# Patient Record
Sex: Female | Born: 1960 | Race: White | Hispanic: No | Marital: Married | Smoking: Former smoker
Health system: Southern US, Community
[De-identification: ages and names within clinical notes are randomized; demographics above are authoritative.]

## PROBLEM LIST (undated history)

## (undated) DIAGNOSIS — K589 Irritable bowel syndrome without diarrhea: Secondary | ICD-10-CM

## (undated) DIAGNOSIS — N83209 Unspecified ovarian cyst, unspecified side: Secondary | ICD-10-CM

## (undated) DIAGNOSIS — F1020 Alcohol dependence, uncomplicated: Secondary | ICD-10-CM

## (undated) DIAGNOSIS — J45909 Unspecified asthma, uncomplicated: Secondary | ICD-10-CM

## (undated) DIAGNOSIS — F329 Major depressive disorder, single episode, unspecified: Secondary | ICD-10-CM

## (undated) DIAGNOSIS — G43909 Migraine, unspecified, not intractable, without status migrainosus: Secondary | ICD-10-CM

## (undated) DIAGNOSIS — N809 Endometriosis, unspecified: Secondary | ICD-10-CM

## (undated) DIAGNOSIS — J302 Other seasonal allergic rhinitis: Secondary | ICD-10-CM

## (undated) DIAGNOSIS — F99 Mental disorder, not otherwise specified: Secondary | ICD-10-CM

## (undated) DIAGNOSIS — J449 Chronic obstructive pulmonary disease, unspecified: Secondary | ICD-10-CM

## (undated) DIAGNOSIS — J3081 Allergic rhinitis due to animal (cat) (dog) hair and dander: Secondary | ICD-10-CM

## (undated) DIAGNOSIS — R921 Mammographic calcification found on diagnostic imaging of breast: Secondary | ICD-10-CM

## (undated) DIAGNOSIS — F419 Anxiety disorder, unspecified: Secondary | ICD-10-CM

## (undated) DIAGNOSIS — F319 Bipolar disorder, unspecified: Secondary | ICD-10-CM

## (undated) DIAGNOSIS — F32A Depression, unspecified: Secondary | ICD-10-CM

## (undated) DIAGNOSIS — I10 Essential (primary) hypertension: Secondary | ICD-10-CM

## (undated) DIAGNOSIS — J40 Bronchitis, not specified as acute or chronic: Secondary | ICD-10-CM

## (undated) DIAGNOSIS — G4700 Insomnia, unspecified: Secondary | ICD-10-CM

## (undated) DIAGNOSIS — D3610 Benign neoplasm of peripheral nerves and autonomic nervous system, unspecified: Secondary | ICD-10-CM

## (undated) HISTORY — DX: Unspecified ovarian cyst, unspecified side: N83.209

## (undated) HISTORY — DX: Other seasonal allergic rhinitis: J30.2

## (undated) HISTORY — DX: Endometriosis, unspecified: N80.9

## (undated) HISTORY — PX: BREAST BIOPSY: SHX20

## (undated) HISTORY — DX: Insomnia, unspecified: G47.00

## (undated) HISTORY — DX: Alcohol dependence, uncomplicated: F10.20

## (undated) HISTORY — DX: Major depressive disorder, single episode, unspecified: F32.9

## (undated) HISTORY — DX: Anxiety disorder, unspecified: F41.9

## (undated) HISTORY — DX: Depression, unspecified: F32.A

## (undated) HISTORY — DX: Mammographic calcification found on diagnostic imaging of breast: R92.1

## (undated) HISTORY — DX: Irritable bowel syndrome, unspecified: K58.9

## (undated) HISTORY — DX: Bronchitis, not specified as acute or chronic: J40

## (undated) HISTORY — DX: Essential (primary) hypertension: I10

## (undated) HISTORY — PX: ABDOMINAL HYSTERECTOMY: SHX81

## (undated) HISTORY — DX: Migraine, unspecified, not intractable, without status migrainosus: G43.909

## (undated) HISTORY — DX: Benign neoplasm of peripheral nerves and autonomic nervous system, unspecified: D36.10

## (undated) HISTORY — DX: Mental disorder, not otherwise specified: F99

## (undated) HISTORY — DX: Allergic rhinitis due to animal (cat) (dog) hair and dander: J30.81

## (undated) HISTORY — PX: WISDOM TOOTH EXTRACTION: SHX21

## (undated) HISTORY — DX: Bipolar disorder, unspecified: F31.9

---

## 1998-05-10 ENCOUNTER — Ambulatory Visit (HOSPITAL_COMMUNITY): Admission: RE | Admit: 1998-05-10 | Discharge: 1998-05-10 | Payer: Self-pay | Admitting: *Deleted

## 1998-05-10 ENCOUNTER — Other Ambulatory Visit: Admission: RE | Admit: 1998-05-10 | Discharge: 1998-05-10 | Payer: Self-pay | Admitting: *Deleted

## 1998-07-06 ENCOUNTER — Ambulatory Visit (HOSPITAL_COMMUNITY): Admission: RE | Admit: 1998-07-06 | Discharge: 1998-07-06 | Payer: Self-pay | Admitting: *Deleted

## 1998-07-06 ENCOUNTER — Encounter: Payer: Self-pay | Admitting: *Deleted

## 1998-10-20 ENCOUNTER — Inpatient Hospital Stay (HOSPITAL_COMMUNITY): Admission: AD | Admit: 1998-10-20 | Discharge: 1998-10-23 | Payer: Self-pay | Admitting: *Deleted

## 1999-06-18 ENCOUNTER — Other Ambulatory Visit: Admission: RE | Admit: 1999-06-18 | Discharge: 1999-06-18 | Payer: Self-pay | Admitting: *Deleted

## 2009-07-03 ENCOUNTER — Emergency Department (HOSPITAL_BASED_OUTPATIENT_CLINIC_OR_DEPARTMENT_OTHER): Admission: EM | Admit: 2009-07-03 | Discharge: 2009-07-03 | Payer: Self-pay | Admitting: Emergency Medicine

## 2009-07-03 ENCOUNTER — Ambulatory Visit: Payer: Self-pay | Admitting: Diagnostic Radiology

## 2010-07-14 ENCOUNTER — Ambulatory Visit: Payer: Self-pay | Admitting: Diagnostic Radiology

## 2010-07-14 ENCOUNTER — Ambulatory Visit (HOSPITAL_BASED_OUTPATIENT_CLINIC_OR_DEPARTMENT_OTHER): Admission: RE | Admit: 2010-07-14 | Discharge: 2010-07-14 | Payer: Self-pay | Admitting: Family Medicine

## 2011-01-24 LAB — GC/CHLAMYDIA PROBE AMP, GENITAL
Chlamydia, DNA Probe: NEGATIVE
GC Probe Amp, Genital: NEGATIVE

## 2011-01-24 LAB — WET PREP, GENITAL
Trich, Wet Prep: NONE SEEN
Yeast Wet Prep HPF POC: NONE SEEN

## 2011-01-24 LAB — CBC
HCT: 39 % (ref 36.0–46.0)
Hemoglobin: 13.5 g/dL (ref 12.0–15.0)
MCHC: 34.6 g/dL (ref 30.0–36.0)
MCV: 100.6 fL — ABNORMAL HIGH (ref 78.0–100.0)
Platelets: 307 10*3/uL (ref 150–400)
RBC: 3.88 MIL/uL (ref 3.87–5.11)
RDW: 12.9 % (ref 11.5–15.5)
WBC: 8.6 10*3/uL (ref 4.0–10.5)

## 2011-01-24 LAB — URINE MICROSCOPIC-ADD ON

## 2011-01-24 LAB — URINALYSIS, ROUTINE W REFLEX MICROSCOPIC
Bilirubin Urine: NEGATIVE
Glucose, UA: NEGATIVE mg/dL
Ketones, ur: NEGATIVE mg/dL
Nitrite: NEGATIVE
Protein, ur: NEGATIVE mg/dL
Specific Gravity, Urine: 1.007 (ref 1.005–1.030)
Urobilinogen, UA: 1 mg/dL (ref 0.0–1.0)
pH: 7 (ref 5.0–8.0)

## 2011-01-24 LAB — BASIC METABOLIC PANEL
BUN: 15 mg/dL (ref 6–23)
CO2: 27 mEq/L (ref 19–32)
Calcium: 9.3 mg/dL (ref 8.4–10.5)
Chloride: 108 mEq/L (ref 96–112)
Creatinine, Ser: 0.7 mg/dL (ref 0.4–1.2)
GFR calc Af Amer: >60 mL/min (ref >60)
GFR calc non Af Amer: >60 mL/min (ref >60)
Glucose, Bld: 86 mg/dL (ref 70–99)
Potassium: 4.6 mEq/L (ref 3.5–5.1)
Sodium: 141 mEq/L (ref 135–145)

## 2011-01-24 LAB — PREGNANCY, URINE: Preg Test, Ur: NEGATIVE

## 2011-01-24 LAB — DIFFERENTIAL
Basophils Absolute: 0.1 10*3/uL (ref 0.0–0.1)
Basophils Relative: 1 % (ref 0–1)
Eosinophils Absolute: 0.4 10*3/uL (ref 0.0–0.7)
Eosinophils Relative: 5 % (ref 0–5)
Lymphocytes Relative: 25 % (ref 12–46)
Lymphs Abs: 2.1 10*3/uL (ref 0.7–4.0)
Monocytes Absolute: 0.5 10*3/uL (ref 0.1–1.0)
Monocytes Relative: 6 % (ref 3–12)
Neutro Abs: 5.5 10*3/uL (ref 1.7–7.7)
Neutrophils Relative %: 63 % (ref 43–77)

## 2011-01-24 LAB — LITHIUM LEVEL: Lithium Lvl: 0.6 mEq/L — ABNORMAL LOW (ref 0.80–1.40)

## 2011-01-24 LAB — HEMOCCULT GUIAC POC 1CARD (OFFICE): Fecal Occult Bld: NEGATIVE

## 2013-03-26 DIAGNOSIS — I10 Essential (primary) hypertension: Secondary | ICD-10-CM | POA: Insufficient documentation

## 2013-03-26 DIAGNOSIS — G43909 Migraine, unspecified, not intractable, without status migrainosus: Secondary | ICD-10-CM | POA: Insufficient documentation

## 2013-03-26 DIAGNOSIS — J309 Allergic rhinitis, unspecified: Secondary | ICD-10-CM | POA: Insufficient documentation

## 2014-01-05 DIAGNOSIS — F411 Generalized anxiety disorder: Secondary | ICD-10-CM | POA: Insufficient documentation

## 2014-01-05 DIAGNOSIS — F3132 Bipolar disorder, current episode depressed, moderate: Secondary | ICD-10-CM | POA: Insufficient documentation

## 2014-06-28 ENCOUNTER — Ambulatory Visit: Payer: Self-pay | Admitting: Medical

## 2014-07-17 ENCOUNTER — Encounter: Payer: Self-pay | Admitting: Physician Assistant

## 2014-07-17 ENCOUNTER — Ambulatory Visit (INDEPENDENT_AMBULATORY_CARE_PROVIDER_SITE_OTHER): Payer: BC Managed Care – PPO | Admitting: Physician Assistant

## 2014-07-17 VITALS — BP 125/86 | HR 74 | Temp 98.4°F | Resp 16 | Ht 60.75 in | Wt 110.5 lb

## 2014-07-17 DIAGNOSIS — J42 Unspecified chronic bronchitis: Secondary | ICD-10-CM

## 2014-07-17 DIAGNOSIS — Z23 Encounter for immunization: Secondary | ICD-10-CM

## 2014-07-17 DIAGNOSIS — I10 Essential (primary) hypertension: Secondary | ICD-10-CM

## 2014-07-17 DIAGNOSIS — F3132 Bipolar disorder, current episode depressed, moderate: Secondary | ICD-10-CM

## 2014-07-17 DIAGNOSIS — R062 Wheezing: Secondary | ICD-10-CM

## 2014-07-17 MED ORDER — ATENOLOL-CHLORTHALIDONE 50-25 MG PO TABS: 0.5000 | ORAL_TABLET | Freq: Every day | ORAL | Status: DC

## 2014-07-17 MED ORDER — ALBUTEROL SULFATE HFA 108 (90 BASE) MCG/ACT IN AERS: 2.0000 | INHALATION_SPRAY | Freq: Four times a day (QID) | RESPIRATORY_TRACT | Status: DC | PRN

## 2014-07-17 MED ORDER — BUDESONIDE-FORMOTEROL FUMARATE 160-4.5 MCG/ACT IN AERO: 2.0000 | INHALATION_SPRAY | Freq: Two times a day (BID) | RESPIRATORY_TRACT | Status: DC

## 2014-07-17 NOTE — Progress Notes (Signed)
Pre visit review using our clinic review tool, if applicable. No additional management support is needed unless otherwise documented below in the visit note/SLS  

## 2014-07-17 NOTE — Patient Instructions (Addendum)
For you hip -- apply topical Aspercreme to the area.  Avoid prolonged sitting. Stretching exercises may be beneficial.  For lungs -- I am referring you to Pulmonology for formal testing of your lung capacity.  I feel that you are suffering from COPD. Please use the albuterol and symbicort inhalers as directed.  Please take a daily multivitamin and probiotic to help boost your immune system.  Follow-up with me within 1 month for a Complete Physical Examination with fasting labs.  Chronic Obstructive Pulmonary Disease Chronic obstructive pulmonary disease (COPD) is a common lung condition in which airflow from the lungs is limited. COPD is a general term that can be used to describe many different lung problems that limit airflow, including both chronic bronchitis and emphysema. If you have COPD, your lung function will probably never return to normal, but there are measures you can take to improve lung function and make yourself feel better.  CAUSES   Smoking (common).   Exposure to secondhand smoke.   Genetic problems.  Chronic inflammatory lung diseases or recurrent infections. SYMPTOMS   Shortness of breath, especially with physical activity.   Deep, persistent (chronic) cough with a large amount of thick mucus.   Wheezing.   Rapid breaths (tachypnea).   Gray or bluish discoloration (cyanosis) of the skin, especially in fingers, toes, or lips.   Fatigue.   Weight loss.   Frequent infections or episodes when breathing symptoms become much worse (exacerbations).   Chest tightness. DIAGNOSIS  Your health care provider will take a medical history and perform a physical examination to make the initial diagnosis. Additional tests for COPD may include:   Lung (pulmonary) function tests.  Chest X-ray.  CT scan.  Blood tests. TREATMENT  Treatment available to help you feel better when you have COPD includes:   Inhaler and nebulizer medicines. These help manage  the symptoms of COPD and make your breathing more comfortable.  Supplemental oxygen. Supplemental oxygen is only helpful if you have a low oxygen level in your blood.   Exercise and physical activity. These are beneficial for nearly all people with COPD. Some people may also benefit from a pulmonary rehabilitation program. HOME CARE INSTRUCTIONS   Take all medicines (inhaled or pills) as directed by your health care provider.  Avoid over-the-counter medicines or cough syrups that dry up your airway (such as antihistamines) and slow down the elimination of secretions unless instructed otherwise by your health care provider.   If you are a smoker, the most important thing that you can do is stop smoking. Continuing to smoke will cause further lung damage and breathing trouble. Ask your health care provider for help with quitting smoking. He or she can direct you to community resources or hospitals that provide support.  Avoid exposure to irritants such as smoke, chemicals, and fumes that aggravate your breathing.  Use oxygen therapy and pulmonary rehabilitation if directed by your health care provider. If you require home oxygen therapy, ask your health care provider whether you should purchase a pulse oximeter to measure your oxygen level at home.   Avoid contact with individuals who have a contagious illness.  Avoid extreme temperature and humidity changes.  Eat healthy foods. Eating smaller, more frequent meals and resting before meals may help you maintain your strength.  Stay active, but balance activity with periods of rest. Exercise and physical activity will help you maintain your ability to do things you want to do.  Preventing infection and hospitalization is very important  when you have COPD. Make sure to receive all the vaccines your health care provider recommends, especially the pneumococcal and influenza vaccines. Ask your health care provider whether you need a pneumonia  vaccine.  Learn and use relaxation techniques to manage stress.  Learn and use controlled breathing techniques as directed by your health care provider. Controlled breathing techniques include:   Pursed lip breathing. Start by breathing in (inhaling) through your nose for 1 second. Then, purse your lips as if you were going to whistle and breathe out (exhale) through the pursed lips for 2 seconds.   Diaphragmatic breathing. Start by putting one hand on your abdomen just above your waist. Inhale slowly through your nose. The hand on your abdomen should move out. Then purse your lips and exhale slowly. You should be able to feel the hand on your abdomen moving in as you exhale.   Learn and use controlled coughing to clear mucus from your lungs. Controlled coughing is a series of short, progressive coughs. The steps of controlled coughing are:  1. Lean your head slightly forward.  2. Breathe in deeply using diaphragmatic breathing.  3. Try to hold your breath for 3 seconds.  4. Keep your mouth slightly open while coughing twice.  5. Spit any mucus out into a tissue.  6. Rest and repeat the steps once or twice as needed. SEEK MEDICAL CARE IF:   You are coughing up more mucus than usual.   There is a change in the color or thickness of your mucus.   Your breathing is more labored than usual.   Your breathing is faster than usual.  SEEK IMMEDIATE MEDICAL CARE IF:   You have shortness of breath while you are resting.   You have shortness of breath that prevents you from:  Being able to talk.   Performing your usual physical activities.   You have chest pain lasting longer than 5 minutes.   Your skin color is more cyanotic than usual.  You measure low oxygen saturations for longer than 5 minutes with a pulse oximeter. MAKE SURE YOU:   Understand these instructions.  Will watch your condition.  Will get help right away if you are not doing well or get  worse. Document Released: 07/16/2005 Document Revised: 02/20/2014 Document Reviewed: 06/02/2013 [facility] Patient Information 2015 [place], [place]. This information is not intended to replace advice given to you by your health care provider. Make sure you discuss any questions you have with your health care provider.

## 2014-07-17 NOTE — Progress Notes (Signed)
Patient presents to clinic today to establish care.  Acute Concerns: Patient wishes to have her lungs checked before getting her flu shot.  Has history of acute bronchitis, most recent episode was 3-4 weeks ago and was treated with Azithromycin.  Patient is a smoker with a very significant pack-year history.  Chronic Issues: Bipolar Disorder -- Followed by [facility] for many years.  Currently on Lithium 300 mg TID and Xanax prn.  Doing well.  Has follow-up scheduled every 3 months.  Endorses recent lithium level within normal limits.  Allergic Rhinitis -- Well controlled with daily Claritin.  Hypertension -- Currently on Tenoretic with good control of BP.  HAs been out of medication for 1 week.  BP is within normal limits today in clinic.  Asymptomatic.  Atenolol in medication helps with migraines.  Past Medical History  Diagnosis Date  . Breast calcification, left   . Endometriosis   . Ovarian cyst   . Neuroma     Left Inguinal  . Bipolar 1 disorder   . Mental disorder   . Alcoholism     Resolved  . Hypertension   . Cat allergies   . Seasonal allergies   . Anxiety   . Insomnia   . Depression   . Migraine   . Bronchitis   . Irritable bowel syndrome (IBS)     Past Surgical History  Procedure Laterality Date  . Breast biopsy      Left  . Abdominal hysterectomy    . Wisdom tooth extraction      No current outpatient prescriptions on file prior to visit.   No current facility-administered medications on file prior to visit.    Allergies  Allergen Reactions  . Cat Hair Extract   . Morphine   . Erythromycin Rash  . Penicillins Rash  . Sulfa Antibiotics Rash    Family History  Problem Relation Age of Onset  . Hyperlipidemia Mother     Living  . Arthritis Mother   . Prostate cancer Father     Unsure if Cancer  . Allergies Father     Living  . Hearing loss Father   . Alcoholism Maternal Grandfather   . Diabetes Paternal  Grandmother   . Epilepsy Maternal Grandmother   . Alcoholism Paternal Grandfather   . Dementia Maternal Grandmother   . Stroke Paternal Grandmother   . Stroke Paternal Grandfather   . Heart disease Maternal Aunt   . Transient ischemic attack Maternal Aunt   . Hyperlipidemia Sister     #1  . Healthy Brother   . Neuropathy Sister     #2  . Irritable bowel syndrome Sister   . Irritable bowel syndrome Father   . Irritable bowel syndrome Son     #1  . Asthma Son   . Allergies Son   . Mental illness Son     Social D/O  . Allergies Son     #2, #3  . Hearing loss Son     #2  . Other Paternal Grandfather     Colon Polyps    History   Social History  . Marital Status: Married    Spouse Name: N/A    Number of Children: N/A  . Years of Education: N/A   Occupational History  . Not on file.   Social History Main Topics  . Smoking status: Current Every Day Smoker -- 0.10 packs/day for 20 years    Types: Cigarettes  . Smokeless tobacco:  Never Used  . Alcohol Use: Not on file  . Drug Use: No  . Sexual Activity: Not Currently   Other Topics Concern  . Not on file   Social History Narrative  . No narrative on file   ROS See HPI.  All other ROS are negative.  BP 125/86  Pulse 74  Temp(Src) 98.4 F (36.9 C) (Oral)  Resp 16  Ht 5' 0.75" (1.543 m)  Wt 110 lb 8 oz (50.122 kg)  BMI 21.05 kg/m2  SpO2 97%  Physical Exam  Vitals reviewed. Constitutional: She is oriented to person, place, and time and well-developed, well-nourished, and in no distress.  HENT:  Head: Normocephalic and atraumatic.  Right Ear: External ear normal.  Left Ear: External ear normal.  Nose: Nose normal.  Mouth/Throat: Oropharynx is clear and moist. No oropharyngeal exudate.  Eyes: Conjunctivae are normal.  Neck: Neck supple.  Cardiovascular: Normal rate, regular rhythm, normal heart sounds and intact distal pulses.   Pulmonary/Chest: Effort normal. No respiratory distress. She has wheezes.  She has no rales. She exhibits no tenderness.  Lymphadenopathy:    She has no cervical adenopathy.  Neurological: She is alert and oriented to person, place, and time.  Skin: Skin is warm and dry. No rash noted.  Psychiatric: Affect normal.   Assessment/Plan: Bipolar affective disorder, currently depressed, moderate Followed by Psychiatry. Doing well.  Continue current regimen.  Need for prophylactic vaccination and inoculation against influenza Immunization given by nursing staff.  Unspecified chronic bronchitis Concern for COPD giving recurrent bronchitis and significant smoking history.  Rx Xopenex.  Referral placed to Pulmonology for PFTs and evaluation.  Essential hypertension, benign Will decrease Tenoretic to 1/2 tablet once daily.  Follow-up in 3-4 weeks for a BP recheck.

## 2014-07-18 DIAGNOSIS — Z23 Encounter for immunization: Secondary | ICD-10-CM | POA: Insufficient documentation

## 2014-07-18 DIAGNOSIS — J42 Unspecified chronic bronchitis: Secondary | ICD-10-CM | POA: Insufficient documentation

## 2014-07-18 DIAGNOSIS — I10 Essential (primary) hypertension: Secondary | ICD-10-CM | POA: Insufficient documentation

## 2014-07-18 NOTE — Assessment & Plan Note (Signed)
Will decrease Tenoretic to 1/2 tablet once daily.  Follow-up in 3-4 weeks for a BP recheck.

## 2014-07-18 NOTE — Assessment & Plan Note (Signed)
Immunization given by nursing staff. 

## 2014-07-18 NOTE — Assessment & Plan Note (Signed)
Followed by Psychiatry. Doing well.  Continue current regimen.

## 2014-07-18 NOTE — Assessment & Plan Note (Signed)
Concern for COPD giving recurrent bronchitis and significant smoking history.  Rx Xopenex.  Referral placed to Pulmonology for PFTs and evaluation.

## 2014-07-31 ENCOUNTER — Institutional Professional Consult (permissible substitution): Payer: BC Managed Care – PPO | Admitting: Internal Medicine

## 2014-08-10 ENCOUNTER — Institutional Professional Consult (permissible substitution): Payer: BC Managed Care – PPO | Admitting: Pulmonary Disease

## 2014-08-16 ENCOUNTER — Encounter: Payer: Self-pay | Admitting: Physician Assistant

## 2014-08-16 ENCOUNTER — Ambulatory Visit (INDEPENDENT_AMBULATORY_CARE_PROVIDER_SITE_OTHER): Payer: BC Managed Care – PPO | Admitting: Physician Assistant

## 2014-08-16 ENCOUNTER — Other Ambulatory Visit (HOSPITAL_COMMUNITY)
Admission: RE | Admit: 2014-08-16 | Discharge: 2014-08-16 | Disposition: A | Discharge status: Home / Self Care | Payer: BC Managed Care – PPO | Source: Ambulatory Visit | Attending: Physician Assistant | Admitting: Physician Assistant

## 2014-08-16 VITALS — BP 115/63 | HR 63 | Temp 97.8°F | Resp 16 | Ht 60.75 in | Wt 114.2 lb

## 2014-08-16 DIAGNOSIS — Z01419 Encounter for gynecological examination (general) (routine) without abnormal findings: Secondary | ICD-10-CM | POA: Insufficient documentation

## 2014-08-16 DIAGNOSIS — Z1239 Encounter for other screening for malignant neoplasm of breast: Secondary | ICD-10-CM

## 2014-08-16 DIAGNOSIS — Z124 Encounter for screening for malignant neoplasm of cervix: Secondary | ICD-10-CM

## 2014-08-16 DIAGNOSIS — Z136 Encounter for screening for cardiovascular disorders: Secondary | ICD-10-CM

## 2014-08-16 DIAGNOSIS — Z0000 Encounter for general adult medical examination without abnormal findings: Secondary | ICD-10-CM

## 2014-08-16 DIAGNOSIS — Z13820 Encounter for screening for osteoporosis: Secondary | ICD-10-CM

## 2014-08-16 LAB — BASIC METABOLIC PANEL WITH GFR
BUN: 15 mg/dL (ref 6–23)
CO2: 24 mEq/L (ref 19–32)
Calcium: 10 mg/dL (ref 8.4–10.5)
Chloride: 100 mEq/L (ref 96–112)
Creat: 0.76 mg/dL (ref 0.50–1.10)
GFR, Est African American: >89 mL/min
GFR, Est Non African American: 89 mL/min
Glucose, Bld: 91 mg/dL (ref 70–99)
Potassium: 3.4 mEq/L — ABNORMAL LOW (ref 3.5–5.3)
Sodium: 136 mEq/L (ref 135–145)

## 2014-08-16 LAB — HEPATIC FUNCTION PANEL
ALT: 17 U/L (ref 0–35)
AST: 19 U/L (ref 0–37)
Albumin: 3.9 g/dL (ref 3.5–5.2)
Alkaline Phosphatase: 146 U/L — ABNORMAL HIGH (ref 39–117)
Bilirubin, Direct: 0.1 mg/dL (ref 0.0–0.3)
Total Bilirubin: 1.1 mg/dL (ref 0.2–1.2)
Total Protein: 7.7 g/dL (ref 6.0–8.3)

## 2014-08-16 LAB — CBC
HCT: 43.2 % (ref 36.0–46.0)
Hemoglobin: 14.1 g/dL (ref 12.0–15.0)
MCHC: 32.6 g/dL (ref 30.0–36.0)
MCV: 98.6 fl (ref 78.0–100.0)
Platelets: 364 10*3/uL (ref 150.0–400.0)
RBC: 4.38 Mil/uL (ref 3.87–5.11)
RDW: 13.9 % (ref 11.5–15.5)
WBC: 13.9 10*3/uL — ABNORMAL HIGH (ref 4.0–10.5)

## 2014-08-16 LAB — TSH: TSH: 2.18 u[IU]/mL (ref 0.35–4.50)

## 2014-08-16 LAB — HEMOGLOBIN A1C: Hgb A1c MFr Bld: 5.3 % (ref 4.6–6.5)

## 2014-08-16 NOTE — Patient Instructions (Signed)
Please continue medications as directed.  Stop by the lab for blood work.  I will call you with your results.  We will alter medication regimen if indicated by results.  You will recevie a phone call for a mammogram for breast cancer screening and bone density scan for osteoporosis screening.  I recommend you schedule these.  This is very important.    Follow-up will be based on lab results.  Preventive Care for Adults A healthy lifestyle and preventive care can promote health and wellness. Preventive health guidelines for women include the following key practices.  A routine yearly physical is a good way to check with your health care provider about your health and preventive screening. It is a chance to share any concerns and updates on your health and to receive a thorough exam.  Visit your dentist for a routine exam and preventive care every 6 months. Brush your teeth twice a day and floss once a day. Good oral hygiene prevents tooth decay and gum disease.  The frequency of eye exams is based on your age, health, family medical history, use of contact lenses, and other factors. Follow your health care provider's recommendations for frequency of eye exams.  Eat a healthy diet. Foods like vegetables, fruits, whole grains, low-fat dairy products, and lean protein foods contain the nutrients you need without too many calories. Decrease your intake of foods high in solid fats, added sugars, and salt. Eat the right amount of calories for you.Get information about a proper diet from your health care provider, if necessary.  Regular physical exercise is one of the most important things you can do for your health. Most adults should get at least 150 minutes of moderate-intensity exercise (any activity that increases your heart rate and causes you to sweat) each week. In addition, most adults need muscle-strengthening exercises on 2 or more days a week.  Maintain a healthy weight. The body mass index  (BMI) is a screening tool to identify possible weight problems. It provides an estimate of body fat based on height and weight. Your health care provider can find your BMI and can help you achieve or maintain a healthy weight.For adults 20 years and older:  A BMI below 18.5 is considered underweight.  A BMI of 18.5 to 24.9 is normal.  A BMI of 25 to 29.9 is considered overweight.  A BMI of 30 and above is considered obese.  Maintain normal blood lipids and cholesterol levels by exercising and minimizing your intake of saturated fat. Eat a balanced diet with plenty of fruit and vegetables. Blood tests for lipids and cholesterol should begin at age 26 and be repeated every 5 years. If your lipid or cholesterol levels are high, you are over 50, or you are at high risk for heart disease, you may need your cholesterol levels checked more frequently.Ongoing high lipid and cholesterol levels should be treated with medicines if diet and exercise are not working.  If you smoke, find out from your health care provider how to quit. If you do not use tobacco, do not start.  Lung cancer screening is recommended for adults aged 68-80 years who are at high risk for developing lung cancer because of a history of smoking. A yearly low-dose CT scan of the lungs is recommended for people who have at least a 30-pack-year history of smoking and are a current smoker or have quit within the past 15 years. A pack year of smoking is smoking an average of 1  pack of cigarettes a day for 1 year (for example: 1 pack a day for 30 years or 2 packs a day for 15 years). Yearly screening should continue until the smoker has stopped smoking for at least 15 years. Yearly screening should be stopped for people who develop a health problem that would prevent them from having lung cancer treatment.  If you are pregnant, do not drink alcohol. If you are breastfeeding, be very cautious about drinking alcohol. If you are not pregnant and  choose to drink alcohol, do not have more than 1 drink per day. One drink is considered to be 12 ounces (355 mL) of beer, 5 ounces (148 mL) of wine, or 1.5 ounces (44 mL) of liquor.  Avoid use of street drugs. Do not share needles with anyone. Ask for help if you need support or instructions about stopping the use of drugs.  High blood pressure causes heart disease and increases the risk of stroke. Your blood pressure should be checked at least every 1 to 2 years. Ongoing high blood pressure should be treated with medicines if weight loss and exercise do not work.  If you are 19-1 years old, ask your health care provider if you should take aspirin to prevent strokes.  Diabetes screening involves taking a blood sample to check your fasting blood sugar level. This should be done once every 3 years, after age 68, if you are within normal weight and without risk factors for diabetes. Testing should be considered at a younger age or be carried out more frequently if you are overweight and have at least 1 risk factor for diabetes.  Breast cancer screening is essential preventive care for women. You should practice "breast self-awareness." This means understanding the normal appearance and feel of your breasts and may include breast self-examination. Any changes detected, no matter how small, should be reported to a health care provider. Women in their 51s and 30s should have a clinical breast exam (CBE) by a health care provider as part of a regular health exam every 1 to 3 years. After age 37, women should have a CBE every year. Starting at age 86, women should consider having a mammogram (breast X-ray test) every year. Women who have a family history of breast cancer should talk to their health care provider about genetic screening. Women at a high risk of breast cancer should talk to their health care providers about having an MRI and a mammogram every year.  Breast cancer gene (BRCA)-related cancer risk  assessment is recommended for women who have family members with BRCA-related cancers. BRCA-related cancers include breast, ovarian, tubal, and peritoneal cancers. Having family members with these cancers may be associated with an increased risk for harmful changes (mutations) in the breast cancer genes BRCA1 and BRCA2. Results of the assessment will determine the need for genetic counseling and BRCA1 and BRCA2 testing.  Routine pelvic exams to screen for cancer are no longer recommended for nonpregnant women who are considered low risk for cancer of the pelvic organs (ovaries, uterus, and vagina) and who do not have symptoms. Ask your health care provider if a screening pelvic exam is right for you.  If you have had past treatment for cervical cancer or a condition that could lead to cancer, you need Pap tests and screening for cancer for at least 20 years after your treatment. If Pap tests have been discontinued, your risk factors (such as having a new sexual partner) need to be reassessed to determine  if screening should be resumed. Some women have medical problems that increase the chance of getting cervical cancer. In these cases, your health care provider may recommend more frequent screening and Pap tests.  The HPV test is an additional test that may be used for cervical cancer screening. The HPV test looks for the virus that can cause the cell changes on the cervix. The cells collected during the Pap test can be tested for HPV. The HPV test could be used to screen women aged 79 years and older, and should be used in women of any age who have unclear Pap test results. After the age of 3, women should have HPV testing at the same frequency as a Pap test.  Colorectal cancer can be detected and often prevented. Most routine colorectal cancer screening begins at the age of 25 years and continues through age 57 years. However, your health care provider may recommend screening at an earlier age if you have  risk factors for colon cancer. On a yearly basis, your health care provider may provide home test kits to check for hidden blood in the stool. Use of a small camera at the end of a tube, to directly examine the colon (sigmoidoscopy or colonoscopy), can detect the earliest forms of colorectal cancer. Talk to your health care provider about this at age 4, when routine screening begins. Direct exam of the colon should be repeated every 5-10 years through age 58 years, unless early forms of pre-cancerous polyps or small growths are found.  People who are at an increased risk for hepatitis B should be screened for this virus. You are considered at high risk for hepatitis B if:  You were born in a country where hepatitis B occurs often. Talk with your health care provider about which countries are considered high risk.  Your parents were born in a high-risk country and you have not received a shot to protect against hepatitis B (hepatitis B vaccine).  You have HIV or AIDS.  You use needles to inject street drugs.  You live with, or have sex with, someone who has hepatitis B.  You get hemodialysis treatment.  You take certain medicines for conditions like cancer, organ transplantation, and autoimmune conditions.  Hepatitis C blood testing is recommended for all people born from [birth year] through 1965 and any individual with known risks for hepatitis C.  Practice safe sex. Use condoms and avoid high-risk sexual practices to reduce the spread of sexually transmitted infections (STIs). STIs include gonorrhea, chlamydia, syphilis, trichomonas, herpes, HPV, and human immunodeficiency virus (HIV). Herpes, HIV, and HPV are viral illnesses that have no cure. They can result in disability, cancer, and death.  You should be screened for sexually transmitted illnesses (STIs) including gonorrhea and chlamydia if:  You are sexually active and are younger than 24 years.  You are older than 24 years and your health  care provider tells you that you are at risk for this type of infection.  Your sexual activity has changed since you were last screened and you are at an increased risk for chlamydia or gonorrhea. Ask your health care provider if you are at risk.  If you are at risk of being infected with HIV, it is recommended that you take a prescription medicine daily to prevent HIV infection. This is called preexposure prophylaxis (PrEP). You are considered at risk if:  You are a heterosexual woman, are sexually active, and are at increased risk for HIV infection.  You take drugs  by injection.  You are sexually active with a partner who has HIV.  Talk with your health care provider about whether you are at high risk of being infected with HIV. If you choose to begin PrEP, you should first be tested for HIV. You should then be tested every 3 months for as long as you are taking PrEP.  Osteoporosis is a disease in which the bones lose minerals and strength with aging. This can result in serious bone fractures or breaks. The risk of osteoporosis can be identified using a bone density scan. Women ages 62 years and over and women at risk for fractures or osteoporosis should discuss screening with their health care providers. Ask your health care provider whether you should take a calcium supplement or vitamin D to reduce the rate of osteoporosis.  Menopause can be associated with physical symptoms and risks. Hormone replacement therapy is available to decrease symptoms and risks. You should talk to your health care provider about whether hormone replacement therapy is right for you.  Use sunscreen. Apply sunscreen liberally and repeatedly throughout the day. You should seek shade when your shadow is shorter than you. Protect yourself by wearing long sleeves, pants, a wide-brimmed hat, and sunglasses year round, whenever you are outdoors.  Once a month, do a whole body skin exam, using a mirror to look at the skin  on your back. Tell your health care provider of new moles, moles that have irregular borders, moles that are larger than a pencil eraser, or moles that have changed in shape or color.  Stay current with required vaccines (immunizations).  Influenza vaccine. All adults should be immunized every year.  Tetanus, diphtheria, and acellular pertussis (Td, Tdap) vaccine. Pregnant women should receive 1 dose of Tdap vaccine during each pregnancy. The dose should be obtained regardless of the length of time since the last dose. Immunization is preferred during the 27th-36th week of gestation. An adult who has not previously received Tdap or who does not know her vaccine status should receive 1 dose of Tdap. This initial dose should be followed by tetanus and diphtheria toxoids (Td) booster doses every 10 years. Adults with an unknown or incomplete history of completing a 3-dose immunization series with Td-containing vaccines should begin or complete a primary immunization series including a Tdap dose. Adults should receive a Td booster every 10 years.  Varicella vaccine. An adult without evidence of immunity to varicella should receive 2 doses or a second dose if she has previously received 1 dose. Pregnant females who do not have evidence of immunity should receive the first dose after pregnancy. This first dose should be obtained before leaving the health care facility. The second dose should be obtained 4-8 weeks after the first dose.  Human papillomavirus (HPV) vaccine. Females aged 13-26 years who have not received the vaccine previously should obtain the 3-dose series. The vaccine is not recommended for use in pregnant females. However, pregnancy testing is not needed before receiving a dose. If a female is found to be pregnant after receiving a dose, no treatment is needed. In that case, the remaining doses should be delayed until after the pregnancy. Immunization is recommended for any person with an  immunocompromised condition through the age of 26 years if she did not get any or all doses earlier. During the 3-dose series, the second dose should be obtained 4-8 weeks after the first dose. The third dose should be obtained 24 weeks after the first dose and 16  weeks after the second dose.  Zoster vaccine. One dose is recommended for adults aged 13 years or older unless certain conditions are present.  Measles, mumps, and rubella (MMR) vaccine. Adults born before [birth year] generally are considered immune to measles and mumps. Adults born in [birth year] or later should have 1 or more doses of MMR vaccine unless there is a contraindication to the vaccine or there is laboratory evidence of immunity to each of the three diseases. A routine second dose of MMR vaccine should be obtained at least 28 days after the first dose for students attending postsecondary schools, health care workers, or international travelers. People who received inactivated measles vaccine or an unknown type of measles vaccine during 1963-1967 should receive 2 doses of MMR vaccine. People who received inactivated mumps vaccine or an unknown type of mumps vaccine before 1979 and are at high risk for mumps infection should consider immunization with 2 doses of MMR vaccine. For females of childbearing age, rubella immunity should be determined. If there is no evidence of immunity, females who are not pregnant should be vaccinated. If there is no evidence of immunity, females who are pregnant should delay immunization until after pregnancy. Unvaccinated health care workers born before [birth year] who lack laboratory evidence of measles, mumps, or rubella immunity or laboratory confirmation of disease should consider measles and mumps immunization with 2 doses of MMR vaccine or rubella immunization with 1 dose of MMR vaccine.  Pneumococcal 13-valent conjugate (PCV13) vaccine. When indicated, a person who is uncertain of her immunization history and has no record  of immunization should receive the PCV13 vaccine. An adult aged 63 years or older who has certain medical conditions and has not been previously immunized should receive 1 dose of PCV13 vaccine. This PCV13 should be followed with a dose of pneumococcal polysaccharide (PPSV23) vaccine. The PPSV23 vaccine dose should be obtained at least 8 weeks after the dose of PCV13 vaccine. An adult aged 62 years or older who has certain medical conditions and previously received 1 or more doses of PPSV23 vaccine should receive 1 dose of PCV13. The PCV13 vaccine dose should be obtained 1 or more years after the last PPSV23 vaccine dose.  Pneumococcal polysaccharide (PPSV23) vaccine. When PCV13 is also indicated, PCV13 should be obtained first. All adults aged 3 years and older should be immunized. An adult younger than age 28 years who has certain medical conditions should be immunized. Any person who resides in a nursing home or long-term care facility should be immunized. An adult smoker should be immunized. People with an immunocompromised condition and certain other conditions should receive both PCV13 and PPSV23 vaccines. People with human immunodeficiency virus (HIV) infection should be immunized as soon as possible after diagnosis. Immunization during chemotherapy or radiation therapy should be avoided. Routine use of PPSV23 vaccine is not recommended for American Indians, [place] Natives, or people younger than 65 years unless there are medical conditions that require PPSV23 vaccine. When indicated, people who have unknown immunization and have no record of immunization should receive PPSV23 vaccine. One-time revaccination 5 years after the first dose of PPSV23 is recommended for people aged 19-64 years who have chronic kidney failure, nephrotic syndrome, asplenia, or immunocompromised conditions. People who received 1-2 doses of PPSV23 before age 68 years should receive another dose of PPSV23 vaccine at age 29 years or  later if at least 5 years have passed since the previous dose. Doses of PPSV23 are not needed for people immunized with PPSV23 at or  after age 73 years.  Meningococcal vaccine. Adults with asplenia or persistent complement component deficiencies should receive 2 doses of quadrivalent meningococcal conjugate (MenACWY-D) vaccine. The doses should be obtained at least 2 months apart. Microbiologists working with certain meningococcal bacteria, [place] recruits, people at risk during an outbreak, and people who travel to or live in countries with a high rate of meningitis should be immunized. A first-year college student up through age 58 years who is living in a residence hall should receive a dose if she did not receive a dose on or after her 16th birthday. Adults who have certain high-risk conditions should receive one or more doses of vaccine.  Hepatitis A vaccine. Adults who wish to be protected from this disease, have certain high-risk conditions, work with hepatitis A-infected animals, work in hepatitis A research labs, or travel to or work in countries with a high rate of hepatitis A should be immunized. Adults who were previously unvaccinated and who anticipate close contact with an international adoptee during the first 60 days after arrival in the [place] from a country with a high rate of hepatitis A should be immunized.  Hepatitis B vaccine. Adults who wish to be protected from this disease, have certain high-risk conditions, may be exposed to blood or other infectious body fluids, are household contacts or sex partners of hepatitis B positive people, are clients or workers in certain care facilities, or travel to or work in countries with a high rate of hepatitis B should be immunized.  Haemophilus influenzae type b (Hib) vaccine. A previously unvaccinated person with asplenia or sickle cell disease or having a scheduled splenectomy should receive 1 dose of Hib vaccine. Regardless of  previous immunization, a recipient of a hematopoietic stem cell transplant should receive a 3-dose series 6-12 months after her successful transplant. Hib vaccine is not recommended for adults with HIV infection. Preventive Services / Frequency Ages 24 to 2 years  Blood pressure check.** / Every 1 to 2 years.  Lipid and cholesterol check.** / Every 5 years beginning at age 52.  Clinical breast exam.** / Every 3 years for women in their 6s and 58s.  BRCA-related cancer risk assessment.** / For women who have family members with a BRCA-related cancer (breast, ovarian, tubal, or peritoneal cancers).  Pap test.** / Every 2 years from ages 72 through 72. Every 3 years starting at age 65 through age 83 or 13 with a history of 3 consecutive normal Pap tests.  HPV screening.** / Every 3 years from ages 77 through ages 32 to 37 with a history of 3 consecutive normal Pap tests.  Hepatitis C blood test.** / For any individual with known risks for hepatitis C.  Skin self-exam. / Monthly.  Influenza vaccine. / Every year.  Tetanus, diphtheria, and acellular pertussis (Tdap, Td) vaccine.** / Consult your health care provider. Pregnant women should receive 1 dose of Tdap vaccine during each pregnancy. 1 dose of Td every 10 years.  Varicella vaccine.** / Consult your health care provider. Pregnant females who do not have evidence of immunity should receive the first dose after pregnancy.  HPV vaccine. / 3 doses over 6 months, if 58 and younger. The vaccine is not recommended for use in pregnant females. However, pregnancy testing is not needed before receiving a dose.  Measles, mumps, rubella (MMR) vaccine.** / You need at least 1 dose of MMR if you were born in 1957 or later. You may also need a 2nd dose. For females of  childbearing age, rubella immunity should be determined. If there is no evidence of immunity, females who are not pregnant should be vaccinated. If there is no evidence of immunity,  females who are pregnant should delay immunization until after pregnancy.  Pneumococcal 13-valent conjugate (PCV13) vaccine.** / Consult your health care provider.  Pneumococcal polysaccharide (PPSV23) vaccine.** / 1 to 2 doses if you smoke cigarettes or if you have certain conditions.  Meningococcal vaccine.** / 1 dose if you are age 61 to 31 years and a Market researcher living in a residence hall, or have one of several medical conditions, you need to get vaccinated against meningococcal disease. You may also need additional booster doses.  Hepatitis A vaccine.** / Consult your health care provider.  Hepatitis B vaccine.** / Consult your health care provider.  Haemophilus influenzae type b (Hib) vaccine.** / Consult your health care provider. Ages 19 to 24 years  Blood pressure check.** / Every 1 to 2 years.  Lipid and cholesterol check.** / Every 5 years beginning at age 16 years.  Lung cancer screening. / Every year if you are aged 84-80 years and have a 30-pack-year history of smoking and currently smoke or have quit within the past 15 years. Yearly screening is stopped once you have quit smoking for at least 15 years or develop a health problem that would prevent you from having lung cancer treatment.  Clinical breast exam.** / Every year after age 78 years.  BRCA-related cancer risk assessment.** / For women who have family members with a BRCA-related cancer (breast, ovarian, tubal, or peritoneal cancers).  Mammogram.** / Every year beginning at age 98 years and continuing for as long as you are in good health. Consult with your health care provider.  Pap test.** / Every 3 years starting at age 52 years through age 63 or 35 years with a history of 3 consecutive normal Pap tests.  HPV screening.** / Every 3 years from ages 65 years through ages 31 to 50 years with a history of 3 consecutive normal Pap tests.  Fecal occult blood test (FOBT) of stool. / Every year  beginning at age 65 years and continuing until age 35 years. You may not need to do this test if you get a colonoscopy every 10 years.  Flexible sigmoidoscopy or colonoscopy.** / Every 5 years for a flexible sigmoidoscopy or every 10 years for a colonoscopy beginning at age 54 years and continuing until age 65 years.  Hepatitis C blood test.** / For all people born from [birth year] through 1966 and any individual with known risks for hepatitis C.  Skin self-exam. / Monthly.  Influenza vaccine. / Every year.  Tetanus, diphtheria, and acellular pertussis (Tdap/Td) vaccine.** / Consult your health care provider. Pregnant women should receive 1 dose of Tdap vaccine during each pregnancy. 1 dose of Td every 10 years.  Varicella vaccine.** / Consult your health care provider. Pregnant females who do not have evidence of immunity should receive the first dose after pregnancy.  Zoster vaccine.** / 1 dose for adults aged 78 years or older.  Measles, mumps, rubella (MMR) vaccine.** / You need at least 1 dose of MMR if you were born in 1957 or later. You may also need a 2nd dose. For females of childbearing age, rubella immunity should be determined. If there is no evidence of immunity, females who are not pregnant should be vaccinated. If there is no evidence of immunity, females who are pregnant should delay immunization until after pregnancy.  Pneumococcal 13-valent conjugate (PCV13) vaccine.** / Consult your health care provider.  Pneumococcal polysaccharide (PPSV23) vaccine.** / 1 to 2 doses if you smoke cigarettes or if you have certain conditions.  Meningococcal vaccine.** / Consult your health care provider.  Hepatitis A vaccine.** / Consult your health care provider.  Hepatitis B vaccine.** / Consult your health care provider.  Haemophilus influenzae type b (Hib) vaccine.** / Consult your health care provider. Ages 66 years and over  Blood pressure check.** / Every 1 to 2 years.  Lipid and  cholesterol check.** / Every 5 years beginning at age 36 years.  Lung cancer screening. / Every year if you are aged 67-80 years and have a 30-pack-year history of smoking and currently smoke or have quit within the past 15 years. Yearly screening is stopped once you have quit smoking for at least 15 years or develop a health problem that would prevent you from having lung cancer treatment.  Clinical breast exam.** / Every year after age 78 years.  BRCA-related cancer risk assessment.** / For women who have family members with a BRCA-related cancer (breast, ovarian, tubal, or peritoneal cancers).  Mammogram.** / Every year beginning at age 20 years and continuing for as long as you are in good health. Consult with your health care provider.  Pap test.** / Every 3 years starting at age 54 years through age 28 or 29 years with 3 consecutive normal Pap tests. Testing can be stopped between 65 and 70 years with 3 consecutive normal Pap tests and no abnormal Pap or HPV tests in the past 10 years.  HPV screening.** / Every 3 years from ages 68 years through ages 18 or 69 years with a history of 3 consecutive normal Pap tests. Testing can be stopped between 65 and 70 years with 3 consecutive normal Pap tests and no abnormal Pap or HPV tests in the past 10 years.  Fecal occult blood test (FOBT) of stool. / Every year beginning at age 67 years and continuing until age 91 years. You may not need to do this test if you get a colonoscopy every 10 years.  Flexible sigmoidoscopy or colonoscopy.** / Every 5 years for a flexible sigmoidoscopy or every 10 years for a colonoscopy beginning at age 37 years and continuing until age 82 years.  Hepatitis C blood test.** / For all people born from [birth year] through 1966 and any individual with known risks for hepatitis C.  Osteoporosis screening.** / A one-time screening for women ages 10 years and over and women at risk for fractures or osteoporosis.  Skin self-exam. /  Monthly.  Influenza vaccine. / Every year.  Tetanus, diphtheria, and acellular pertussis (Tdap/Td) vaccine.** / 1 dose of Td every 10 years.  Varicella vaccine.** / Consult your health care provider.  Zoster vaccine.** / 1 dose for adults aged 14 years or older.  Pneumococcal 13-valent conjugate (PCV13) vaccine.** / Consult your health care provider.  Pneumococcal polysaccharide (PPSV23) vaccine.** / 1 dose for all adults aged 54 years and older.  Meningococcal vaccine.** / Consult your health care provider.  Hepatitis A vaccine.** / Consult your health care provider.  Hepatitis B vaccine.** / Consult your health care provider.  Haemophilus influenzae type b (Hib) vaccine.** / Consult your health care provider. ** Family history and personal history of risk and conditions may change your health care provider's recommendations. Document Released: 12/02/2001 Document Revised: 02/20/2014 Document Reviewed: 03/03/2011 [facility] Patient Information 2015 [place], [place]. This information is not intended to replace advice  to you by your health care provider. Make sure you discuss any questions you have with your health care provider.  

## 2014-08-16 NOTE — Progress Notes (Signed)
Pre visit review using our clinic review tool, if applicable. No additional management support is needed unless otherwise documented below in the visit note/SLS  

## 2014-08-16 NOTE — Progress Notes (Signed)
Patient presents to clinic today for annual exam and gynecological exam with PAP.  Patient is fasting for labs. Patient is s/p TAH including oophorectomy.  Is unsure if her cervix was removed in the procedure.   Health Maintenance: Dental -- Edentulous; up-to-date Vision -- Overdue. Immunizations --  Flu and Tetanus up-to-date. Colonoscopy -- Last Colonoscopy in 2012; no abnormalities. Mammogram -- Overdue.  Last in 2010.  Previous abnormal Mammogram with lumpectomy.  Biopsy reveals calcium deposit.  No Mammogram at present. PAP -- Overdue. Will be getting today. Bone Density -- Never had. Will place order today.  Patient also with history of COPD, predominantly chronic bronchitis. Has resumed Symbicort and Albuterol with marked improvement.  Was referred to Pulmonology but had to cancel as finances have become tight.  Will reschedule when finances improved for evaluation and PFTs.  Past Medical History  Diagnosis Date  . Breast calcification, left   . Endometriosis   . Ovarian cyst   . Neuroma     Left Inguinal  . Bipolar 1 disorder   . Mental disorder   . Alcoholism     Resolved  . Hypertension   . Cat allergies   . Seasonal allergies   . Anxiety   . Insomnia   . Depression   . Migraine   . Bronchitis   . Irritable bowel syndrome (IBS)     Past Surgical History  Procedure Laterality Date  . Breast biopsy      Left  . Abdominal hysterectomy    . Wisdom tooth extraction      Current Outpatient Prescriptions on File Prior to Visit  Medication Sig Dispense Refill  . ALPRAZolam (XANAX) 1 MG tablet Take 1 mg by mouth 3 (three) times daily as needed for anxiety or sleep.    Marland Kitchen atenolol-chlorthalidone (TENORETIC) 50-25 MG per tablet Take 0.5 tablets by mouth daily. 30 tablet 2  . budesonide-formoterol (SYMBICORT) 160-4.5 MCG/ACT inhaler Inhale 2 puffs into the lungs 2 (two) times daily. 1 Inhaler 3  . lithium 300 MG tablet Take 300 mg by mouth 3 (three) times daily.      Marland Kitchen loratadine (CLARITIN) 10 MG tablet Take 10 mg by mouth daily.    . Nutritional Supplements (CARNATION INSTANT BREAKFAST) LIQD Take 1 each by mouth every morning.    Marland Kitchen albuterol (PROVENTIL HFA;VENTOLIN HFA) 108 (90 BASE) MCG/ACT inhaler Inhale 2 puffs into the lungs every 6 (six) hours as needed for wheezing or shortness of breath. 1 Inhaler 0   No current facility-administered medications on file prior to visit.    Allergies  Allergen Reactions  . Cat Hair Extract   . Morphine   . Erythromycin Rash  . Penicillins Rash  . Sulfa Antibiotics Rash    Family History  Problem Relation Age of Onset  . Hyperlipidemia Mother     Living  . Arthritis Mother   . Prostate cancer Father     Unsure if Cancer  . Allergies Father     Living  . Hearing loss Father   . Alcoholism Maternal Grandfather   . Diabetes Paternal Grandmother   . Epilepsy Maternal Grandmother   . Alcoholism Paternal Grandfather   . Dementia Maternal Grandmother   . Stroke Paternal Grandmother   . Stroke Paternal Grandfather   . Heart disease Maternal Aunt   . Transient ischemic attack Maternal Aunt   . Hyperlipidemia Sister     #1  . Healthy Brother   . Neuropathy Sister     #  2  . Irritable bowel syndrome Sister   . Irritable bowel syndrome Father   . Irritable bowel syndrome Son     #1  . Asthma Son   . Allergies Son   . Mental illness Son     Social D/O  . Allergies Son     #2, #3  . Hearing loss Son     #2  . Other Paternal Grandfather     Colon Polyps    History   Social History  . Marital Status: Married    Spouse Name: N/A    Number of Children: N/A  . Years of Education: N/A   Occupational History  . Not on file.   Social History Main Topics  . Smoking status: Current Every Day Smoker -- 0.10 packs/day for 20 years    Types: Cigarettes  . Smokeless tobacco: Never Used  . Alcohol Use: Not on file  . Drug Use: No  . Sexual Activity: Not Currently   Other Topics Concern  . Not  on file   Social History Narrative   Review of Systems  Constitutional: Negative for fever and weight loss.  HENT: Negative for ear discharge, ear pain, hearing loss and tinnitus.   Eyes: Negative for blurred vision, double vision, photophobia, pain, discharge and redness.  Respiratory: Positive for cough and sputum production. Negative for shortness of breath and wheezing.   Cardiovascular: Negative for chest pain and palpitations.  Gastrointestinal: Negative for heartburn, nausea, vomiting, abdominal pain, diarrhea, constipation, blood in stool and melena.  Genitourinary: Negative for dysuria, urgency, frequency, hematuria and flank pain.  Neurological: Negative for dizziness, loss of consciousness and headaches.  Psychiatric/Behavioral: Negative for depression, suicidal ideas, hallucinations and substance abuse. The patient is not nervous/anxious and does not have insomnia.    BP 115/63 mmHg  Pulse 63  Temp(Src) 97.8 F (36.6 C) (Oral)  Resp 16  Ht 5' 0.75" (1.543 m)  Wt 114 lb 4 oz (51.823 kg)  BMI 21.77 kg/m2  SpO2 96%  Physical Exam  Vitals reviewed. Constitutional: She is oriented to person, place, and time and well-developed, well-nourished, and in no distress.  HENT:  Head: Normocephalic and atraumatic.  Right Ear: External ear normal.  Left Ear: External ear normal.  Nose: Nose normal.  Mouth/Throat: Oropharynx is clear and moist. No oropharyngeal exudate.  TM within normal limits bilaterally.  Eyes: Conjunctivae are normal. Pupils are equal, round, and reactive to light.  Neck: Neck supple. No thyromegaly present.  Cardiovascular: Normal rate, regular rhythm, normal heart sounds and intact distal pulses.   Pulmonary/Chest: Effort normal and breath sounds normal. No respiratory distress. She has no wheezes. She has no rales. She exhibits no tenderness.  Abdominal: Soft. Bowel sounds are normal. She exhibits no distension and no mass. There is no tenderness. There is  no rebound and no guarding.  Genitourinary: Vagina normal. No vaginal discharge found.  Uterus, Cervix and Adnexa are surgically absent.  Lymphadenopathy:    She has no cervical adenopathy.  Neurological: She is alert and oriented to person, place, and time.  Skin: Skin is warm and dry. No rash noted.  Psychiatric: Affect normal.   Assessment/Plan: Visit for preventive health examination History reviewed and updated.  Routine gynecological examination performed, including PAP smear as patient unaware if cervix remained after hysterectomy.  Patient up-to-date on required immunizations.  Will obtain fasting labs at today's visit.  Screening, ischemic heart disease EKG obtained today without concerning findings.  Encouraged 81 mg ASA daily.  Will obtain Lipid Panel at today's visit.  Osteoporosis screening Overdue for DEXA.  Will place order for bone density scan to assess for osteoporosis.  Breast cancer screening Order for screening mammogram placed as is overdue.  Breast exam performed without palpation of lesion or other concerning finding.  Encounter for cervical Pap smear with pelvic exam Cervix is surgically absent.  PAP was obtained sweeping "blind end" where cervix was removed to assess for any abnormal cells.  If PAP is normal, patient should not require another.

## 2014-08-17 ENCOUNTER — Telehealth: Payer: Self-pay | Admitting: Physician Assistant

## 2014-08-17 LAB — LIPID PANEL W/DIRECT LDL/HDL RATIO
Cholesterol: 229 mg/dL — ABNORMAL HIGH (ref 0–200)
Direct LDL: 161 mg/dL — ABNORMAL HIGH
HDL: 50 mg/dL (ref >39)
LDL:HDL Ratio: 3.2 Ratio
Total Chol/HDL Ratio: 4.6 Ratio
Triglycerides: 155 mg/dL — ABNORMAL HIGH (ref <150)

## 2014-08-17 LAB — URINALYSIS, ROUTINE W REFLEX MICROSCOPIC
Bilirubin Urine: NEGATIVE
Hgb urine dipstick: NEGATIVE
Ketones, ur: NEGATIVE
Leukocytes, UA: NEGATIVE
Nitrite: NEGATIVE
Specific Gravity, Urine: 1.015 (ref 1.000–1.030)
Total Protein, Urine: NEGATIVE
Urine Glucose: NEGATIVE
Urobilinogen, UA: 0.2 (ref 0.0–1.0)
pH: 6.5 (ref 5.0–8.0)

## 2014-08-17 NOTE — Telephone Encounter (Signed)
emmi mailed  °

## 2014-08-18 ENCOUNTER — Telehealth: Payer: Self-pay | Admitting: Physician Assistant

## 2014-08-18 DIAGNOSIS — E785 Hyperlipidemia, unspecified: Secondary | ICD-10-CM

## 2014-08-18 DIAGNOSIS — R748 Abnormal levels of other serum enzymes: Secondary | ICD-10-CM

## 2014-08-18 LAB — CYTOLOGY - PAP

## 2014-08-18 MED ORDER — SIMVASTATIN 10 MG PO TABS: 10.0000 mg | ORAL_TABLET | Freq: Every day | ORAL | Status: DC

## 2014-08-18 NOTE — Telephone Encounter (Signed)
LMOM with contact name and number for return call RE: results and further provider instructions/SLS  

## 2014-08-18 NOTE — Telephone Encounter (Signed)
Labs are in.  There is mild elevation of her WBC and Alk Phos.  This could be an isolated abnormal lab value, but I do want to assess patient for abdominal pain (RUQ), nausea or vomiting, fever or chills.  If none present, she needs to return to clinic in 3-4 weeks for repeat CBC and hepatic function panel (already placed).  If any symptoms are present, let me know and we will obtain and Abdominal US to look at liver and gallbladder. LDL cholesterol is elevated > 160. We need to start a medication.  I am calling in a Rx for Simvastatin 10 mg daily. Also limit intake of foods high in cholesterol or saturated fats and increase aerobic exercise.

## 2014-08-20 DIAGNOSIS — Z13820 Encounter for screening for osteoporosis: Secondary | ICD-10-CM | POA: Insufficient documentation

## 2014-08-20 DIAGNOSIS — Z1239 Encounter for other screening for malignant neoplasm of breast: Secondary | ICD-10-CM | POA: Insufficient documentation

## 2014-08-20 DIAGNOSIS — Z01419 Encounter for gynecological examination (general) (routine) without abnormal findings: Secondary | ICD-10-CM | POA: Insufficient documentation

## 2014-08-20 DIAGNOSIS — Z136 Encounter for screening for cardiovascular disorders: Secondary | ICD-10-CM | POA: Insufficient documentation

## 2014-08-20 DIAGNOSIS — Z0000 Encounter for general adult medical examination without abnormal findings: Secondary | ICD-10-CM | POA: Insufficient documentation

## 2014-08-20 NOTE — Assessment & Plan Note (Signed)
Order for screening mammogram placed as is overdue.  Breast exam performed without palpation of lesion or other concerning finding.

## 2014-08-20 NOTE — Assessment & Plan Note (Signed)
Overdue for DEXA.  Will place order for bone density scan to assess for osteoporosis.

## 2014-08-20 NOTE — Assessment & Plan Note (Signed)
EKG obtained today without concerning findings.  Encouraged 81 mg ASA daily.  Will obtain Lipid Panel at today's visit.

## 2014-08-20 NOTE — Assessment & Plan Note (Signed)
Cervix is surgically absent.  PAP was obtained sweeping "blind end" where cervix was removed to assess for any abnormal cells.  If PAP is normal, patient should not require another.

## 2014-08-20 NOTE — Assessment & Plan Note (Signed)
History reviewed and updated.  Routine gynecological examination performed, including PAP smear as patient unaware if cervix remained after hysterectomy.  Patient up-to-date on required immunizations.  Will obtain fasting labs at today's visit.

## 2014-08-21 NOTE — Telephone Encounter (Signed)
Patient informed, understood & agreed/SLS  

## 2014-11-03 ENCOUNTER — Encounter: Payer: Self-pay | Admitting: Family Medicine

## 2014-11-03 ENCOUNTER — Ambulatory Visit (INDEPENDENT_AMBULATORY_CARE_PROVIDER_SITE_OTHER): Payer: BLUE CROSS/BLUE SHIELD | Admitting: Family Medicine

## 2014-11-03 VITALS — BP 120/80 | HR 71 | Temp 98.1°F | Resp 18 | Wt 117.0 lb

## 2014-11-03 DIAGNOSIS — J0100 Acute maxillary sinusitis, unspecified: Secondary | ICD-10-CM | POA: Insufficient documentation

## 2014-11-03 DIAGNOSIS — J441 Chronic obstructive pulmonary disease with (acute) exacerbation: Secondary | ICD-10-CM | POA: Insufficient documentation

## 2014-11-03 MED ORDER — IPRATROPIUM-ALBUTEROL 0.5-2.5 (3) MG/3ML IN SOLN
3.0000 mL | Freq: Once | RESPIRATORY_TRACT | Status: AC
  Administered 2014-11-03: 3 mL via RESPIRATORY_TRACT

## 2014-11-03 MED ORDER — PROMETHAZINE-DM 6.25-15 MG/5ML PO SYRP: 5.0000 mL | ORAL_SOLUTION | Freq: Four times a day (QID) | ORAL | Status: DC | PRN

## 2014-11-03 MED ORDER — DOXYCYCLINE HYCLATE 100 MG PO TABS: 100.0000 mg | ORAL_TABLET | Freq: Two times a day (BID) | ORAL | Status: DC

## 2014-11-03 NOTE — Progress Notes (Signed)
   Subjective:    Patient ID: Tracie Smith, female    DOB: 03-29-1961, 54 y.o.   MRN: [id number]  HPI URI- sxs started 6 days ago w/ cough and nasal congestion.  + subjective fever.  + facial pain/pressure.  Hx of asthma, now w/ increased SOB, chest tightness.  + HA.  Cough is rarely productive.  Increased wheezing.  + post-tussive emesis.  + sick contacts.  Using Symbicort- 'i can't afford the albuterol'.   Review of Systems For ROS see HPI     Objective:   Physical Exam  Constitutional: She appears well-developed and well-nourished. No distress.  HENT:  Head: Normocephalic and atraumatic.  Right Ear: Tympanic membrane normal.  Left Ear: Tympanic membrane normal.  Nose: Mucosal edema and rhinorrhea present. Right sinus exhibits maxillary sinus tenderness and frontal sinus tenderness. Left sinus exhibits maxillary sinus tenderness and frontal sinus tenderness.  Mouth/Throat: Uvula is midline and mucous membranes are normal. Posterior oropharyngeal erythema present. No oropharyngeal exudate.  Eyes: Conjunctivae and EOM are normal. Pupils are equal, round, and reactive to light.  Neck: Normal range of motion. Neck supple.  Cardiovascular: Normal rate, regular rhythm and normal heart sounds.   Pulmonary/Chest: Effort normal. No respiratory distress. She has wheezes (pt w/ diffuse expiratory wheezes w/ poor air movement throughout- air movement and wheezes improved s/p neb tx in office).  Lymphadenopathy:    She has no cervical adenopathy.  Vitals reviewed.         Assessment & Plan:

## 2014-11-03 NOTE — Progress Notes (Signed)
Pre visit review using our clinic review tool, if applicable. No additional management support is needed unless otherwise documented below in the visit note. 

## 2014-11-03 NOTE — Patient Instructions (Signed)
Follow up as needed Start the Doxycycline twice daily- take w/ food Promethazine cough syrup as needed- will cause drowsiness Use the Albuterol inhaler as needed for cough, shortness of breath, wheezing REST! Drink plenty of fluids Call with any questions or concerns Hang in there!!

## 2014-11-06 NOTE — Assessment & Plan Note (Signed)
New.  Pt's sxs and PE consistent w/ infxn.  Start abx.  Cough meds prn.  Reviewed supportive care and red flags that should prompt return.  Pt expressed understanding and is in agreement w/ plan.

## 2014-11-06 NOTE — Assessment & Plan Note (Signed)
New.  Pt's coarse BS, decreased air movement, wheezes consistent w/ COPD exacerbation.  Start abx.  Pt's sxs improved after neb in office.  Pt reports she can't afford inhalers or neb tx.  Pro-Air inhaler sample provided.  Reviewed supportive care and red flags that should prompt return.  Pt expressed understanding and is in agreement w/ plan.

## 2015-01-16 ENCOUNTER — Telehealth: Payer: Self-pay | Admitting: Physician Assistant

## 2015-01-16 ENCOUNTER — Emergency Department (HOSPITAL_BASED_OUTPATIENT_CLINIC_OR_DEPARTMENT_OTHER)
Admission: EM | Admit: 2015-01-16 | Discharge: 2015-01-16 | Disposition: A | Discharge status: Home / Self Care | Payer: Medicaid Other | Attending: Emergency Medicine | Admitting: Emergency Medicine

## 2015-01-16 ENCOUNTER — Encounter (HOSPITAL_BASED_OUTPATIENT_CLINIC_OR_DEPARTMENT_OTHER): Payer: Self-pay | Admitting: *Deleted

## 2015-01-16 ENCOUNTER — Emergency Department (HOSPITAL_BASED_OUTPATIENT_CLINIC_OR_DEPARTMENT_OTHER): Payer: Medicaid Other

## 2015-01-16 DIAGNOSIS — Z8669 Personal history of other diseases of the nervous system and sense organs: Secondary | ICD-10-CM | POA: Diagnosis not present

## 2015-01-16 DIAGNOSIS — K589 Irritable bowel syndrome without diarrhea: Secondary | ICD-10-CM | POA: Diagnosis not present

## 2015-01-16 DIAGNOSIS — J45901 Unspecified asthma with (acute) exacerbation: Secondary | ICD-10-CM | POA: Diagnosis not present

## 2015-01-16 DIAGNOSIS — F419 Anxiety disorder, unspecified: Secondary | ICD-10-CM | POA: Diagnosis not present

## 2015-01-16 DIAGNOSIS — Z720 Tobacco use: Secondary | ICD-10-CM | POA: Insufficient documentation

## 2015-01-16 DIAGNOSIS — J45909 Unspecified asthma, uncomplicated: Secondary | ICD-10-CM | POA: Diagnosis present

## 2015-01-16 DIAGNOSIS — Z880 Allergy status to penicillin: Secondary | ICD-10-CM | POA: Diagnosis not present

## 2015-01-16 DIAGNOSIS — R0602 Shortness of breath: Secondary | ICD-10-CM

## 2015-01-16 DIAGNOSIS — Z79899 Other long term (current) drug therapy: Secondary | ICD-10-CM | POA: Insufficient documentation

## 2015-01-16 DIAGNOSIS — Z8742 Personal history of other diseases of the female genital tract: Secondary | ICD-10-CM | POA: Insufficient documentation

## 2015-01-16 DIAGNOSIS — F329 Major depressive disorder, single episode, unspecified: Secondary | ICD-10-CM | POA: Diagnosis not present

## 2015-01-16 DIAGNOSIS — J441 Chronic obstructive pulmonary disease with (acute) exacerbation: Secondary | ICD-10-CM

## 2015-01-16 DIAGNOSIS — I10 Essential (primary) hypertension: Secondary | ICD-10-CM | POA: Insufficient documentation

## 2015-01-16 MED ORDER — PREDNISONE 20 MG PO TABS: 40.0000 mg | ORAL_TABLET | Freq: Every day | ORAL | Status: DC

## 2015-01-16 MED ORDER — FENTANYL CITRATE 0.05 MG/ML IJ SOLN
50.0000 ug | Freq: Once | INTRAMUSCULAR | Status: AC
  Administered 2015-01-16: 50 ug via INTRAVENOUS
  Filled 2015-01-16: qty 2

## 2015-01-16 MED ORDER — ALBUTEROL SULFATE HFA 108 (90 BASE) MCG/ACT IN AERS: 2.0000 | INHALATION_SPRAY | Freq: Four times a day (QID) | RESPIRATORY_TRACT | Status: DC | PRN

## 2015-01-16 MED ORDER — IPRATROPIUM BROMIDE 0.02 % IN SOLN
0.5000 mg | Freq: Once | RESPIRATORY_TRACT | Status: AC
  Administered 2015-01-16: 0.5 mg via RESPIRATORY_TRACT
  Filled 2015-01-16: qty 2.5

## 2015-01-16 MED ORDER — BUDESONIDE-FORMOTEROL FUMARATE 160-4.5 MCG/ACT IN AERO: 2.0000 | INHALATION_SPRAY | Freq: Two times a day (BID) | RESPIRATORY_TRACT | Status: DC

## 2015-01-16 MED ORDER — ALBUTEROL SULFATE HFA 108 (90 BASE) MCG/ACT IN AERS
1.0000 | INHALATION_SPRAY | RESPIRATORY_TRACT | Status: DC | PRN
  Administered 2015-01-16: 2 via RESPIRATORY_TRACT
  Filled 2015-01-16: qty 6.7

## 2015-01-16 MED ORDER — PREDNISONE 50 MG PO TABS
60.0000 mg | ORAL_TABLET | Freq: Once | ORAL | Status: AC
  Administered 2015-01-16: 60 mg via ORAL
  Filled 2015-01-16 (×2): qty 1

## 2015-01-16 MED ORDER — SODIUM CHLORIDE 0.9 % IV BOLUS (SEPSIS)
1000.0000 mL | Freq: Once | INTRAVENOUS | Status: AC
  Administered 2015-01-16: 1000 mL via INTRAVENOUS

## 2015-01-16 MED ORDER — MAGNESIUM SULFATE 2 GM/50ML IV SOLN
2.0000 g | Freq: Once | INTRAVENOUS | Status: AC
  Administered 2015-01-16: 2 g via INTRAVENOUS
  Filled 2015-01-16: qty 50

## 2015-01-16 MED ORDER — ALBUTEROL SULFATE (2.5 MG/3ML) 0.083% IN NEBU
5.0000 mg | INHALATION_SOLUTION | Freq: Once | RESPIRATORY_TRACT | Status: AC
  Administered 2015-01-16: 5 mg via RESPIRATORY_TRACT
  Filled 2015-01-16: qty 6

## 2015-01-16 MED ORDER — ALBUTEROL (5 MG/ML) CONTINUOUS INHALATION SOLN
15.0000 mg/h | INHALATION_SOLUTION | Freq: Once | RESPIRATORY_TRACT | Status: AC
  Administered 2015-01-16: 15 mg/h via RESPIRATORY_TRACT
  Filled 2015-01-16: qty 20

## 2015-01-16 MED ORDER — ONDANSETRON HCL 4 MG/2ML IJ SOLN
4.0000 mg | Freq: Once | INTRAMUSCULAR | Status: AC
  Administered 2015-01-16: 4 mg via INTRAVENOUS
  Filled 2015-01-16: qty 2

## 2015-01-16 MED ORDER — ALBUTEROL SULFATE (2.5 MG/3ML) 0.083% IN NEBU
2.5000 mg | INHALATION_SOLUTION | Freq: Four times a day (QID) | RESPIRATORY_TRACT | Status: DC | PRN
Start: 2015-01-16 — End: 2017-06-19

## 2015-01-16 NOTE — Telephone Encounter (Signed)
Caller name:Dacy, Curt Bears Relation to JD:[facility] Call back number:[phone] Pharmacy:  Reason for call: pt would like for you to call her regarding provair samples, states she has questions on what else she can use since she does not have any insurance.

## 2015-01-16 NOTE — Telephone Encounter (Signed)
Pt is calling back following up, pt states she really wants to talk with sharon because she wanted to explain to her that the provair does not help her, and she is really struggling with her asthma now and she wants to know if Einar Pheasant can call her in something else at the pharmacy .

## 2015-01-16 NOTE — Telephone Encounter (Signed)
Spoke with patient and informed her of provider's instructions and suggestions. Patient was having a lot of Anxiety on phone and stated that "she cannot open her door and go outside because she has so much SOB"; she is out of her Symbicort and cannot afford Albuterol HFA. Informed patient that she needed to be seen in Emergent facility where they have the capability to treat her there at the ED; pt stated "but I don't want to go", I informed her that I understood that but it was in her and her healths best interest that she get treatment for her COPD/Asthma exacerbation with her symptoms of complaint. Patient is in the process of getting Insured by Medicaid, but is awaiting the final approval at this time and cannot afford to pay for OV. Informed patient that she cannot be refused treatment at an ED/ER and she needed to tell them all her symptoms when she checks-in/registers. After more convincing patient that this was her best route to take today, she finally gave in and agreed to proceed to ED. Informed patient that we had a Rx Assist coupon for her Inhaler at the front desk for her ready for p/u during regular business hours, that I would send Rx for Albuterol HFA & Symbicort Inhaler [also sent Albuterol Nebulized solution, as pt states she has nebulizer machine at home] to her [facility];  Pt understood & agreed. Reiterated that she needs to move forward when Medicaid is approved with appointment to Pulmonology, as they are the specialist in respiratory diseases and would be of great help to her in attaining her needed respiratory medications; pt understood & agreed/SLS

## 2015-01-16 NOTE — Discharge Instructions (Signed)

## 2015-01-16 NOTE — ED Provider Notes (Addendum)
CSN: [id number]     Arrival date & time 01/16/15  1811 History  This chart was scribed for Blanchie Dessert, MD by Tula Nakayama, ED Scribe. This patient was seen in room MH07/MH07 and the patient's care was started at 6:22 PM.    Chief Complaint  Patient presents with  . Asthma   The history is provided by the patient. No language interpreter was used.    HPI Comments: Tracie Smith is a 54 y.o. female with a history of asthma who presents to the Emergency Department complaining of constant, moderate SOB that started 2 days ago. Pt states nonproductive cough as an associated symptom. She has tried Benadryl with no relief. Pt has a history of asthma which she treats with Symbicort. She notes that ran out of her inhaler 2 days ago and does not have insurance to refill her prescription. Pt called her PCP for her symptom who referred her to the ED. She denies fever.  Past Medical History  Diagnosis Date  . Breast calcification, left   . Endometriosis   . Ovarian cyst   . Neuroma     Left Inguinal  . Bipolar 1 disorder   . Mental disorder   . Alcoholism     Resolved  . Hypertension   . Cat allergies   . Seasonal allergies   . Anxiety   . Insomnia   . Depression   . Migraine   . Bronchitis   . Irritable bowel syndrome (IBS)    Past Surgical History  Procedure Laterality Date  . Breast biopsy      Left  . Abdominal hysterectomy    . Wisdom tooth extraction     Family History  Problem Relation Age of Onset  . Hyperlipidemia Mother     Living  . Arthritis Mother   . Prostate cancer Father     Unsure if Cancer  . Allergies Father     Living  . Hearing loss Father   . Alcoholism Maternal Grandfather   . Diabetes Paternal Grandmother   . Epilepsy Maternal Grandmother   . Alcoholism Paternal Grandfather   . Dementia Maternal Grandmother   . Stroke Paternal Grandmother   . Stroke Paternal Grandfather   . Heart disease Maternal Aunt   . Transient ischemic attack  Maternal Aunt   . Hyperlipidemia Sister     #1  . Healthy Brother   . Neuropathy Sister     #2  . Irritable bowel syndrome Sister   . Irritable bowel syndrome Father   . Irritable bowel syndrome Son     #1  . Asthma Son   . Allergies Son   . Mental illness Son     Social D/O  . Allergies Son     #2, #3  . Hearing loss Son     #2  . Other Paternal Grandfather     Colon Polyps   History  Substance Use Topics  . Smoking status: Current Every Day Smoker -- 0.10 packs/day for 20 years    Types: Cigarettes  . Smokeless tobacco: Never Used  . Alcohol Use: Not on file   OB History    No data available     Review of Systems  Constitutional: Negative for fever.  Respiratory: Positive for cough and shortness of breath.   All other systems reviewed and are negative.  Allergies  Cat hair extract; Morphine; Erythromycin; Penicillins; and Sulfa antibiotics  Home Medications   Prior to Admission medications  Medication Sig Start Date End Date Taking? Authorizing Provider  albuterol (PROVENTIL HFA;VENTOLIN HFA) 108 (90 BASE) MCG/ACT inhaler Inhale 2 puffs into the lungs every 6 (six) hours as needed for wheezing or shortness of breath. 01/16/15   Brunetta Jeans, PA-C  albuterol (PROVENTIL) (2.5 MG/3ML) 0.083% nebulizer solution Take 3 mLs (2.5 mg total) by nebulization every 6 (six) hours as needed for wheezing or shortness of breath. 01/16/15   Brunetta Jeans, PA-C  ALPRAZolam Duanne Moron) 1 MG tablet Take 1 mg by mouth 3 (three) times daily as needed for anxiety or sleep.    Historical Provider, MD  atenolol-chlorthalidone (TENORETIC) 50-25 MG per tablet Take 0.5 tablets by mouth daily. 07/17/14   Brunetta Jeans, PA-C  budesonide-formoterol [facility]) 160-4.5 MCG/ACT inhaler Inhale 2 puffs into the lungs 2 (two) times daily. 01/16/15   Brunetta Jeans, PA-C  doxycycline (VIBRA-TABS) 100 MG tablet Take 1 tablet (100 mg total) by mouth 2 (two) times daily. 11/03/14   Midge Minium, MD  lithium 300 MG tablet Take 300 mg by mouth 3 (three) times daily.    Historical Provider, MD  loratadine (CLARITIN) 10 MG tablet Take 10 mg by mouth daily.    Historical Provider, MD  Nutritional Supplements (CARNATION INSTANT BREAKFAST) LIQD Take 1 each by mouth every morning.    Historical Provider, MD  promethazine-dextromethorphan (PROMETHAZINE-DM) 6.25-15 MG/5ML syrup Take 5 mLs by mouth 4 (four) times daily as needed. 11/03/14   Midge Minium, MD  simvastatin (ZOCOR) 10 MG tablet Take 1 tablet (10 mg total) by mouth at bedtime. 08/18/14   Brunetta Jeans, PA-C   BP 154/117 mmHg  Pulse 95  Temp(Src) 97.8 F (36.6 C) (Oral)  Resp 20  Ht 5' (1.524 m)  Wt 117 lb (53.071 kg)  BMI 22.85 kg/m2  SpO2 95% Physical Exam  Constitutional: She appears well-developed and well-nourished. No distress.  HENT:  Head: Normocephalic and atraumatic.  Clear fluid behind both TMs; swollen nasal turbinates  Eyes: Conjunctivae and EOM are normal.  Neck: Neck supple. No tracheal deviation present.  Cardiovascular: Normal rate.   Pulmonary/Chest: Effort normal. No respiratory distress.  Decreased breath sounds in all 4 fields; coughing on exam; expiratory wheezing  Skin: Skin is warm and dry.  Psychiatric: She has a normal mood and affect. Her behavior is normal.  Nursing note and vitals reviewed.   ED Course  Procedures   DIAGNOSTIC STUDIES: Oxygen Saturation is 95% on RA, adequate by my interpretation.    COORDINATION OF CARE: 6:30 PM Discussed treatment plan with pt which includes Prednisone and Albuterol treatment. She agreed to plan.   Labs Review Labs Reviewed - No data to display  Imaging Review Dg Chest 2 View  01/16/2015   CLINICAL DATA:  Shortness of breath. Asthma. Cough. Right-sided chest pain.  EXAM: CHEST  2 VIEW  COMPARISON:  01/10/2014  FINDINGS: Heart size and pulmonary vascularity are normal. The lungs are clear. No osseous abnormality. The lungs are  somewhat hyperinflated.  IMPRESSION: Hyperinflated lungs.  Otherwise normal exam.   Electronically Signed   By: Lorriane Shire M.D.   On: 01/16/2015 20:52     EKG Interpretation None      MDM   Final diagnoses:  SOB (shortness of breath)  Asthma exacerbation    Pt with typical asthma exacerbation  Symptoms most likely caused by pollen and running out of her inhaler.  No infectious sx, productive cough or other complaints.  Wheezing on exam.  will give steroids, albuterol/atrovent and recheck.  7:42 PM Patient is now moving air better with more diffuse wheezing. Will give a second treatment  10:40 PM On repeat evaluation patient had diffuse wheezing. She was given IV mag, fluids and pain control. Chest x-ray without acute findings. Also received an hour-long continuous albuterol neb. On reevaluation patient's wheezing is significantly improved she is able to ambulate without difficulty. Discharge home with steroids and an albuterol inhaler. She already has prescription for Symbicort  I personally performed the services described in this documentation, which was scribed in my presence.  The recorded information has been reviewed and considered.   Blanchie Dessert, MD 01/16/15 0383  Blanchie Dessert, MD 01/16/15 2302

## 2015-01-16 NOTE — Telephone Encounter (Signed)
Unfortunately we do not have samples. Without insurance most of the traditional medications will be 70-80 dollars.  I have found a coupon on [employer] for her inhaler.  Will make it cost 50 dollars.   This is the best option I have at present. I have placed the coupon at front desk with her name on it. I hope this helps. Also with a  [employer] membership (100.00 per year) I believe you get up to 4 generic medications free per month.  They may want to look into that.

## 2015-01-16 NOTE — ED Notes (Signed)
Ran out of asthma medications 2 days ago. Sob.

## 2015-01-26 ENCOUNTER — Telehealth: Payer: Self-pay | Admitting: Physician Assistant

## 2015-01-26 NOTE — Telephone Encounter (Signed)
Attempted to call patient no answer.  Please try her again later to assess her questions.  I know I placed a referral last September but she would never respond to Pulmonary when they called to set up appointment. If just needing another referral, ok to place.  IF other concern, please let me know.

## 2015-01-26 NOTE — Telephone Encounter (Signed)
Caller name: idalee Relation to pt: self Call back number: [phone] Pharmacy:  Reason for call:   Please call patient. She has additional questions regarding referral to pulmonary.

## 2015-01-26 NOTE — Telephone Encounter (Signed)
Reached husband Louie Casa. LM with him for her to call back.

## 2015-01-26 NOTE — Telephone Encounter (Signed)
Referral was made in September 2015, Status is Canceled, Basil Dess was left on VM by [place] for return call on July 28, 2015?/SLS

## 2015-07-31 ENCOUNTER — Other Ambulatory Visit: Payer: Self-pay | Admitting: Physician Assistant

## 2016-03-22 IMAGING — DX DG CHEST 2V
2 series · 2 of 2 positions shown · non-contrast
Comparison: 01/10/2014

CLINICAL DATA: Shortness of breath. Asthma. Cough. Right-sided
chest pain.

EXAM:
CHEST  2 VIEW

[chest pa]
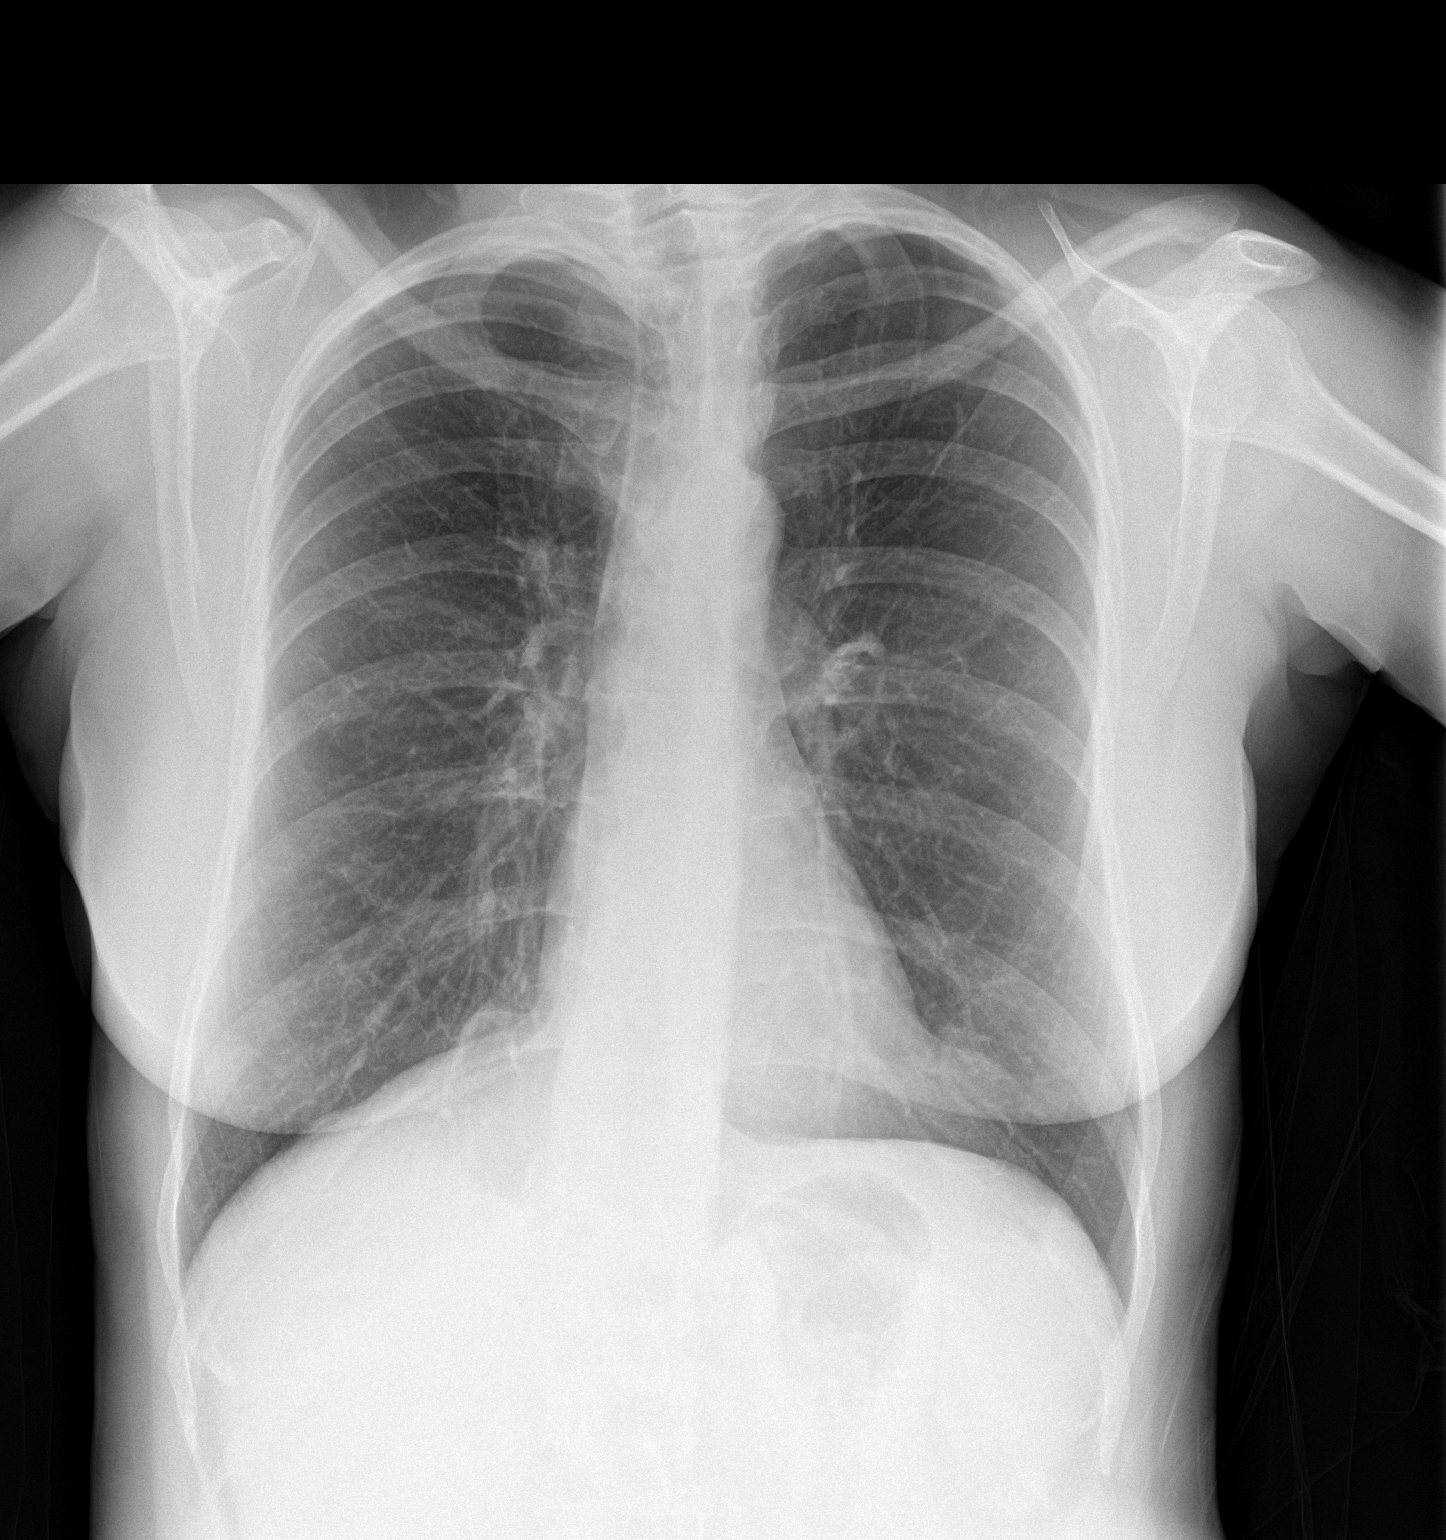

[chest lat]
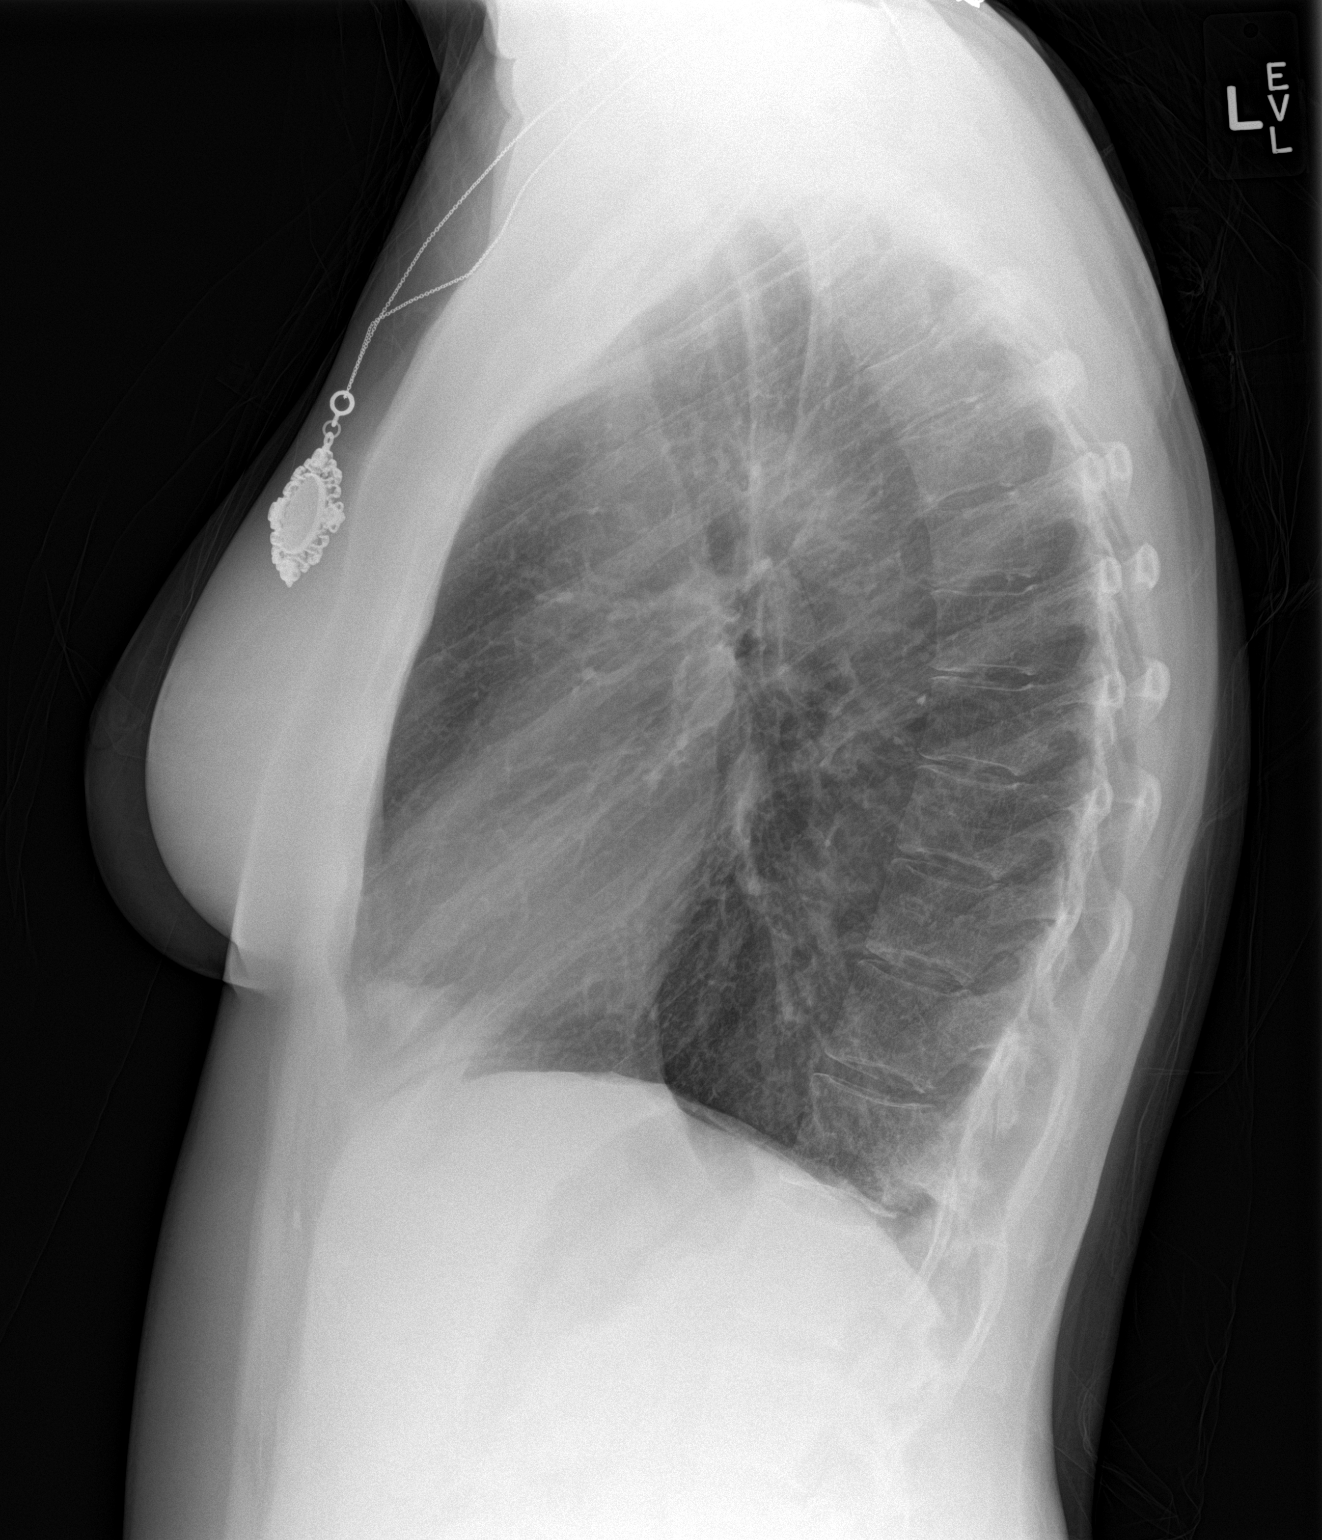

[2 of 2 positions shown; findings below may reference images not displayed]

FINDINGS: Heart size and pulmonary vascularity are normal. The lungs are
clear. No osseous abnormality. The lungs are somewhat hyperinflated.
IMPRESSION: Hyperinflated lungs.  Otherwise normal exam.

## 2017-01-24 ENCOUNTER — Emergency Department
Admission: EM | Admit: 2017-01-24 | Discharge: 2017-01-24 | Disposition: A | Discharge status: Home / Self Care | Payer: Self-pay | Source: Home / Self Care | Attending: Family Medicine | Admitting: Family Medicine

## 2017-01-24 ENCOUNTER — Encounter: Payer: Self-pay | Admitting: Emergency Medicine

## 2017-01-24 DIAGNOSIS — J4531 Mild persistent asthma with (acute) exacerbation: Secondary | ICD-10-CM

## 2017-01-24 DIAGNOSIS — R05 Cough: Secondary | ICD-10-CM

## 2017-01-24 DIAGNOSIS — R059 Cough, unspecified: Secondary | ICD-10-CM

## 2017-01-24 HISTORY — DX: Unspecified asthma, uncomplicated: J45.909

## 2017-01-24 MED ORDER — IBUPROFEN 600 MG PO TABS
600.0000 mg | ORAL_TABLET | Freq: Once | ORAL | Status: AC
  Administered 2017-01-24: 600 mg via ORAL

## 2017-01-24 MED ORDER — METHYLPREDNISOLONE SODIUM SUCC 40 MG IJ SOLR
80.0000 mg | Freq: Once | INTRAMUSCULAR | Status: AC
  Administered 2017-01-24: 80 mg via INTRAMUSCULAR

## 2017-01-24 MED ORDER — PREDNISONE 20 MG PO TABS: ORAL_TABLET | ORAL | 0 refills | Status: DC

## 2017-01-24 MED ORDER — IPRATROPIUM-ALBUTEROL 0.5-2.5 (3) MG/3ML IN SOLN
3.0000 mL | Freq: Once | RESPIRATORY_TRACT | Status: AC
  Administered 2017-01-24: 3 mL via RESPIRATORY_TRACT

## 2017-01-24 MED ORDER — AZITHROMYCIN 250 MG PO TABS: 250.0000 mg | ORAL_TABLET | Freq: Every day | ORAL | 0 refills | Status: DC

## 2017-01-24 MED ORDER — ALBUTEROL SULFATE HFA 108 (90 BASE) MCG/ACT IN AERS: 1.0000 | INHALATION_SPRAY | Freq: Four times a day (QID) | RESPIRATORY_TRACT | 0 refills | Status: DC | PRN

## 2017-01-24 MED ORDER — ALBUTEROL SULFATE (5 MG/ML) 0.5% IN NEBU: 2.5000 mg | INHALATION_SOLUTION | Freq: Four times a day (QID) | RESPIRATORY_TRACT | 12 refills | Status: DC | PRN

## 2017-01-24 NOTE — ED Triage Notes (Signed)
Patient states she has asthma and had a flair up last Saturday, 1 week ago and since that time she has had problems which are getting worse. She has had some environmental exposure this week to pollens and pets. C/o feeling SOB and difficulity breathing. O2 Sat 94% on room air

## 2017-01-24 NOTE — ED Provider Notes (Signed)
CSN: [id number]     Arrival date & time 01/24/17  1713 History   First MD Initiated Contact with Patient 01/24/17 1733     Chief Complaint  Patient presents with  . Asthma   (Consider location/radiation/quality/duration/timing/severity/associated sxs/prior Treatment) HPI  Tracie Smith is a 56 y.o. female presenting to UC with c/o asthma exacerbation that started about 1 week ago after she helped pet sit.  She notes she was exposed to cats and pollen which she knows she is allergic to.  She has had worsening SOB, wheeze, and dry hacking cough.  Associated generalized headache that started today.  She recently lost her insurance but still has some albuterol nebulizer treatments at home she can use.  Denies fever, chills, n/v/d. No known sick contacts.    Past Medical History:  Diagnosis Date  . Alcoholism ([place])    Resolved  . Anxiety   . Asthma   . Bipolar 1 disorder ([place])   . Breast calcification, left   . Bronchitis   . Cat allergies   . Depression   . Endometriosis   . Hypertension   . Insomnia   . Irritable bowel syndrome (IBS)   . Mental disorder   . Migraine   . Neuroma    Left Inguinal  . Ovarian cyst   . Seasonal allergies    Past Surgical History:  Procedure Laterality Date  . ABDOMINAL HYSTERECTOMY    . BREAST BIOPSY     Left  . WISDOM TOOTH EXTRACTION     Family History  Problem Relation Age of Onset  . Hyperlipidemia Mother     Living  . Arthritis Mother   . Prostate cancer Father     Unsure if Cancer  . Allergies Father     Living  . Hearing loss Father   . Irritable bowel syndrome Father   . Alcoholism Maternal Grandfather   . Diabetes Paternal Grandmother   . Stroke Paternal Grandmother   . Epilepsy Maternal Grandmother   . Dementia Maternal Grandmother   . Alcoholism Paternal Grandfather   . Stroke Paternal Grandfather   . Other Paternal Grandfather     Colon Polyps  . Heart disease Maternal Aunt   . Transient ischemic attack Maternal  Aunt   . Hyperlipidemia Sister     #1  . Healthy Brother   . Neuropathy Sister     #2  . Irritable bowel syndrome Sister   . Irritable bowel syndrome Son     #1  . Asthma Son   . Allergies Son   . Mental illness Son     Social D/O  . Allergies Son     #2, #3  . Hearing loss Son     #2   Social History  Substance Use Topics  . Smoking status: Current Every Day Smoker    Packs/day: 0.10    Years: 20.00    Types: Cigarettes  . Smokeless tobacco: Never Used  . Alcohol use No   OB History    No data available     Review of Systems  Constitutional: Negative for chills and fever.  HENT: Negative for congestion, ear pain, sore throat, trouble swallowing and voice change.   Respiratory: Positive for cough, chest tightness, shortness of breath and wheezing.   Cardiovascular: Negative for chest pain and palpitations.  Gastrointestinal: Negative for abdominal pain, diarrhea, nausea and vomiting.  Musculoskeletal: Negative for arthralgias, back pain and myalgias.  Skin: Negative for rash.    Allergies  Cat hair extract; Morphine; Erythromycin; Penicillins; and Sulfa antibiotics  Home Medications   Prior to Admission medications   Medication Sig Start Date End Date Taking? Authorizing Provider  albuterol (PROVENTIL HFA;VENTOLIN HFA) 108 (90 Base) MCG/ACT inhaler Inhale 1-2 puffs into the lungs every 6 (six) hours as needed for wheezing or shortness of breath. 01/24/17   Noland Fordyce, PA-C  albuterol (PROVENTIL) (2.5 MG/3ML) 0.083% nebulizer solution Take 3 mLs (2.5 mg total) by nebulization every 6 (six) hours as needed for wheezing or shortness of breath. 01/16/15   Brunetta Jeans, PA-C  albuterol (PROVENTIL) (5 MG/ML) 0.5% nebulizer solution Take 0.5 mLs (2.5 mg total) by nebulization every 6 (six) hours as needed for wheezing or shortness of breath. 01/24/17   Noland Fordyce, PA-C  ALPRAZolam Duanne Moron) 1 MG tablet Take 1 mg by mouth 3 (three) times daily as needed for anxiety or  sleep.    Historical Provider, MD  atenolol-chlorthalidone (TENORETIC) 50-25 MG per tablet Take 0.5 tablets by mouth daily. 07/17/14   Brunetta Jeans, PA-C  azithromycin (ZITHROMAX) 250 MG tablet Take 1 tablet (250 mg total) by mouth daily. Take first 2 tablets together, then 1 every day until finished. 01/24/17   Noland Fordyce, PA-C  budesonide-formoterol (SYMBICORT) 160-4.5 MCG/ACT inhaler Inhale 2 puffs into the lungs 2 (two) times daily. 01/16/15   Brunetta Jeans, PA-C  doxycycline (VIBRA-TABS) 100 MG tablet Take 1 tablet (100 mg total) by mouth 2 (two) times daily. 11/03/14   Midge Minium, MD  lithium 300 MG tablet Take 300 mg by mouth 3 (three) times daily.    Historical Provider, MD  loratadine (CLARITIN) 10 MG tablet Take 10 mg by mouth daily.    Historical Provider, MD  Nutritional Supplements (CARNATION INSTANT BREAKFAST) LIQD Take 1 each by mouth every morning.    Historical Provider, MD  predniSONE (DELTASONE) 20 MG tablet Take 2 tablets (40 mg total) by mouth daily. 01/16/15   Blanchie Dessert, MD  predniSONE (DELTASONE) 20 MG tablet 3 tabs po day one, then 2 po daily x 4 days 01/24/17   Noland Fordyce, PA-C  promethazine-dextromethorphan (PROMETHAZINE-DM) 6.25-15 MG/5ML syrup Take 5 mLs by mouth 4 (four) times daily as needed. 11/03/14   Midge Minium, MD  PROVENTIL HFA 108 (90 BASE) MCG/ACT inhaler INHALE TWO PUFFS INTO LUNGS EVERY 6 HOURS AS NEEDED FOR WHEEZING AND FOR SHORTNESS OF BREATH 07/31/15   Brunetta Jeans, PA-C  simvastatin (ZOCOR) 10 MG tablet Take 1 tablet (10 mg total) by mouth at bedtime. 08/18/14   Brunetta Jeans, PA-C   Meds Ordered and Administered this Visit   Medications  ibuprofen (ADVIL,MOTRIN) tablet 600 mg (600 mg Oral Given 01/24/17 1745)  methylPREDNISolone sodium succinate (SOLU-MEDROL) 40 mg/mL injection 80 mg (80 mg Intramuscular Given 01/24/17 1745)  ipratropium-albuterol (DUONEB) 0.5-2.5 (3) MG/3ML nebulizer solution 3 mL (3 mLs Nebulization Given  01/24/17 1745)    BP (!) 142/95 (BP Location: Left Arm)   Pulse 90   Temp 97.8 F (36.6 C) (Oral)   Resp (!) 22   Ht 5' 1.75" (1.568 m)   Wt 135 lb 4 oz (61.3 kg)   SpO2 96%   BMI 24.94 kg/m  No data found.   Physical Exam  Constitutional: She appears well-developed and well-nourished. No distress.  HENT:  Head: Normocephalic and atraumatic.  Eyes: Conjunctivae are normal. No scleral icterus.  Neck: Normal range of motion.  Cardiovascular: Normal rate, regular rhythm and normal heart sounds.  Pulmonary/Chest: Effort normal. No respiratory distress. She has wheezes. She has rhonchi. She has no rales. She exhibits no tenderness.  Abdominal: Soft. Bowel sounds are normal. She exhibits no distension and no mass. There is no tenderness. There is no rebound and no guarding.  Musculoskeletal: Normal range of motion.  Neurological: She is alert.  Skin: Skin is warm and dry. She is not diaphoretic.  Nursing note and vitals reviewed.   Urgent Care Course     Procedures (including critical care time)  Labs Review Labs Reviewed - No data to display  Imaging Review No results found.   MDM   1. Mild persistent asthma with exacerbation   2. Cough    Pt presenting to UC with exacerbation of her asthma.   Diffuse wheeze and rhonchi.   Tx in UC: Solumedrol 80mg  IM and duoneb given in UC  O2 Sat improved from 94% to 96% on RA Wheeze still present but pt notes she feels some improvement.  Rx: Azithromycin (to hold but can fill if fever develops or not improving after prednisone use), prednisone to start in morning, albuterol nebulizer solution and inhaler refills  Sample of Symbicort provided to pt as she notes she recently lost her health insurance this past week and is out of symbicort but use to use twice daily for maintenance for years.     Noland Fordyce, PA-C 01/24/17 1815

## 2017-05-03 ENCOUNTER — Emergency Department (HOSPITAL_BASED_OUTPATIENT_CLINIC_OR_DEPARTMENT_OTHER): Payer: Self-pay

## 2017-05-03 ENCOUNTER — Encounter (HOSPITAL_BASED_OUTPATIENT_CLINIC_OR_DEPARTMENT_OTHER): Payer: Self-pay | Admitting: *Deleted

## 2017-05-03 ENCOUNTER — Emergency Department (HOSPITAL_BASED_OUTPATIENT_CLINIC_OR_DEPARTMENT_OTHER)
Admission: EM | Admit: 2017-05-03 | Discharge: 2017-05-03 | Disposition: A | Discharge status: Home / Self Care | Payer: Self-pay | Attending: Emergency Medicine | Admitting: Emergency Medicine

## 2017-05-03 DIAGNOSIS — Z79899 Other long term (current) drug therapy: Secondary | ICD-10-CM | POA: Insufficient documentation

## 2017-05-03 DIAGNOSIS — I10 Essential (primary) hypertension: Secondary | ICD-10-CM | POA: Insufficient documentation

## 2017-05-03 DIAGNOSIS — F17210 Nicotine dependence, cigarettes, uncomplicated: Secondary | ICD-10-CM | POA: Insufficient documentation

## 2017-05-03 DIAGNOSIS — J45909 Unspecified asthma, uncomplicated: Secondary | ICD-10-CM | POA: Insufficient documentation

## 2017-05-03 DIAGNOSIS — J441 Chronic obstructive pulmonary disease with (acute) exacerbation: Secondary | ICD-10-CM | POA: Insufficient documentation

## 2017-05-03 LAB — CBC WITH DIFFERENTIAL/PLATELET
Basophils Absolute: 0.1 10*3/uL (ref 0.0–0.1)
Basophils Relative: 1 %
Eosinophils Absolute: 1.1 10*3/uL — ABNORMAL HIGH (ref 0.0–0.7)
Eosinophils Relative: 10 %
HCT: 43.8 % (ref 36.0–46.0)
Hemoglobin: 14.3 g/dL (ref 12.0–15.0)
Lymphocytes Relative: 25 %
Lymphs Abs: 2.7 10*3/uL (ref 0.7–4.0)
MCH: 32 pg (ref 26.0–34.0)
MCHC: 32.6 g/dL (ref 30.0–36.0)
MCV: 98 fL (ref 78.0–100.0)
Monocytes Absolute: 0.6 10*3/uL (ref 0.1–1.0)
Monocytes Relative: 5 %
Neutro Abs: 6.5 10*3/uL (ref 1.7–7.7)
Neutrophils Relative %: 59 %
Platelets: 339 10*3/uL (ref 150–400)
RBC: 4.47 MIL/uL (ref 3.87–5.11)
RDW: 12.7 % (ref 11.5–15.5)
WBC: 11 10*3/uL — ABNORMAL HIGH (ref 4.0–10.5)

## 2017-05-03 LAB — TROPONIN I: Troponin I: <0.03 ng/mL (ref <0.03)

## 2017-05-03 LAB — BASIC METABOLIC PANEL
Anion gap: 9 (ref 5–15)
BUN: 7 mg/dL (ref 6–20)
CO2: 26 mmol/L (ref 22–32)
Calcium: 9.4 mg/dL (ref 8.9–10.3)
Chloride: 104 mmol/L (ref 101–111)
Creatinine, Ser: 1.01 mg/dL — ABNORMAL HIGH (ref 0.44–1.00)
GFR calc Af Amer: >60 mL/min (ref >60)
GFR calc non Af Amer: >60 mL/min (ref >60)
Glucose, Bld: 126 mg/dL — ABNORMAL HIGH (ref 65–99)
Potassium: 3.5 mmol/L (ref 3.5–5.1)
Sodium: 139 mmol/L (ref 135–145)

## 2017-05-03 MED ORDER — MAGNESIUM SULFATE 2 GM/50ML IV SOLN
INTRAVENOUS | Status: AC
  Administered 2017-05-03: 2 g
  Filled 2017-05-03: qty 50

## 2017-05-03 MED ORDER — ALBUTEROL SULFATE (2.5 MG/3ML) 0.083% IN NEBU
2.5000 mg | INHALATION_SOLUTION | Freq: Once | RESPIRATORY_TRACT | Status: AC
  Administered 2017-05-03: 2.5 mg via RESPIRATORY_TRACT
  Filled 2017-05-03: qty 3

## 2017-05-03 MED ORDER — MAGNESIUM SULFATE 50 % IJ SOLN: 2.0000 g | Freq: Once | INTRAMUSCULAR | Status: DC

## 2017-05-03 MED ORDER — PREDNISONE 20 MG PO TABS: ORAL_TABLET | ORAL | 0 refills | Status: DC

## 2017-05-03 MED ORDER — DOXYCYCLINE HYCLATE 100 MG PO CAPS: 100.0000 mg | ORAL_CAPSULE | Freq: Two times a day (BID) | ORAL | 0 refills | Status: DC

## 2017-05-03 MED ORDER — PREDNISONE 50 MG PO TABS
60.0000 mg | ORAL_TABLET | Freq: Once | ORAL | Status: AC
  Administered 2017-05-03: 60 mg via ORAL
  Filled 2017-05-03: qty 1

## 2017-05-03 MED ORDER — ALBUTEROL SULFATE (2.5 MG/3ML) 0.083% IN NEBU
5.0000 mg | INHALATION_SOLUTION | Freq: Once | RESPIRATORY_TRACT | Status: AC
  Administered 2017-05-03: 5 mg via RESPIRATORY_TRACT
  Filled 2017-05-03: qty 6

## 2017-05-03 MED ORDER — GUAIFENESIN-CODEINE 100-10 MG/5ML PO SOLN: 10.0000 mL | Freq: Four times a day (QID) | ORAL | 0 refills | Status: DC | PRN

## 2017-05-03 MED ORDER — IPRATROPIUM-ALBUTEROL 0.5-2.5 (3) MG/3ML IN SOLN
3.0000 mL | Freq: Once | RESPIRATORY_TRACT | Status: AC
  Administered 2017-05-03: 3 mL via RESPIRATORY_TRACT
  Filled 2017-05-03: qty 3

## 2017-05-03 MED ORDER — ALBUTEROL (5 MG/ML) CONTINUOUS INHALATION SOLN
10.0000 mg/h | INHALATION_SOLUTION | RESPIRATORY_TRACT | Status: DC
  Administered 2017-05-03: 10 mg/h via RESPIRATORY_TRACT
  Filled 2017-05-03: qty 20

## 2017-05-03 NOTE — ED Triage Notes (Signed)
Pt has COPD, states that she has had a cough and SOB x 3 weeks.  States that she stayed at [facility] that has indoor cats and had increased sob since that time. Ambulatory. Cough noted.

## 2017-05-03 NOTE — ED Provider Notes (Signed)
[facility] Provider Note   CSN: [id number] Arrival date & time: 05/03/17  1329     History   Chief Complaint Chief Complaint  Patient presents with  . Shortness of Breath    HPI Tracie Smith is a 55 y.o. female.  Patient is a 3 show female with a history of asthma, COPD, bipolar disorder, prior alcohol abuse who presents with shortness of breath. She states that 2 weeks ago she stayed at her son's house for 2 days. He had cats there that she feels triggered an asthma/COPD exacerbation. She's had ongoing wheezing and cough since that time. She denies any fever. She has some tightness across her chest which has improved after nebulizer treatments. Her cough is dry in nature. She denies any fevers. No nausea or vomiting. She's been using her nebulizer treatments at home every 4-6 hours with some improvement in symptoms but her symptoms are ongoing. He felt worse today so she like she needs to be evaluated.      Past Medical History:  Diagnosis Date  . Alcoholism ([place])    Resolved  . Anxiety   . Asthma   . Bipolar 1 disorder ([place])   . Breast calcification, left   . Bronchitis   . Cat allergies   . Depression   . Endometriosis   . Hypertension   . Insomnia   . Irritable bowel syndrome (IBS)   . Mental disorder   . Migraine   . Neuroma    Left Inguinal  . Ovarian cyst   . Seasonal allergies     Patient Active Problem List   Diagnosis Date Noted  . COPD exacerbation ([place]) 11/03/2014  . Acute maxillary sinusitis 11/03/2014  . Screening, ischemic heart disease 08/20/2014  . Visit for preventive health examination 08/20/2014  . Osteoporosis screening 08/20/2014  . Breast cancer screening 08/20/2014  . Encounter for cervical Pap smear with pelvic exam 08/20/2014  . Essential hypertension, benign 07/18/2014  . Unspecified chronic bronchitis ([place]) 07/18/2014  . Need for prophylactic vaccination and inoculation against influenza 07/18/2014  . Anxiety  state 01/05/2014  . Bipolar affective disorder, currently depressed, moderate ([place]) 01/05/2014  . Allergic rhinitis 03/26/2013  . Headache, migraine 03/26/2013    Past Surgical History:  Procedure Laterality Date  . ABDOMINAL HYSTERECTOMY    . BREAST BIOPSY     Left  . WISDOM TOOTH EXTRACTION      OB History    No data available       Home Medications    Prior to Admission medications   Medication Sig Start Date End Date Taking? Authorizing Provider  albuterol (PROVENTIL HFA;VENTOLIN HFA) 108 (90 Base) MCG/ACT inhaler Inhale 1-2 puffs into the lungs every 6 (six) hours as needed for wheezing or shortness of breath. 01/24/17   Noe Gens, PA-C  albuterol (PROVENTIL) (2.5 MG/3ML) 0.083% nebulizer solution Take 3 mLs (2.5 mg total) by nebulization every 6 (six) hours as needed for wheezing or shortness of breath. 01/16/15   Brunetta Jeans, PA-C  albuterol (PROVENTIL) (5 MG/ML) 0.5% nebulizer solution Take 0.5 mLs (2.5 mg total) by nebulization every 6 (six) hours as needed for wheezing or shortness of breath. 01/24/17   Noe Gens, PA-C  ALPRAZolam Duanne Moron) 1 MG tablet Take 1 mg by mouth 3 (three) times daily as needed for anxiety or sleep.    [provider]  atenolol-chlorthalidone (TENORETIC) 50-25 MG per tablet Take 0.5 tablets by mouth daily. 07/17/14   Raiford Noble  C, PA-C  azithromycin (ZITHROMAX) 250 MG tablet Take 1 tablet (250 mg total) by mouth daily. Take first 2 tablets together, then 1 every day until finished. 01/24/17   Noe Gens, PA-C  budesonide-formoterol (SYMBICORT) 160-4.5 MCG/ACT inhaler Inhale 2 puffs into the lungs 2 (two) times daily. 01/16/15   Brunetta Jeans, PA-C  doxycycline (VIBRA-TABS) 100 MG tablet Take 1 tablet (100 mg total) by mouth 2 (two) times daily. 11/03/14   Midge Minium, MD  lithium 300 MG tablet Take 300 mg by mouth 3 (three) times daily.    [provider]  loratadine (CLARITIN) 10 MG tablet Take 10 mg by  mouth daily.    [provider]  Nutritional Supplements (CARNATION INSTANT BREAKFAST) LIQD Take 1 each by mouth every morning.    [provider]  predniSONE (DELTASONE) 20 MG tablet 2 tabs po daily x 4 days 05/03/17   Malvin Johns, MD  promethazine-dextromethorphan (PROMETHAZINE-DM) 6.25-15 MG/5ML syrup Take 5 mLs by mouth 4 (four) times daily as needed. 11/03/14   Midge Minium, MD  PROVENTIL HFA 108 (90 BASE) MCG/ACT inhaler INHALE TWO PUFFS INTO LUNGS EVERY 6 HOURS AS NEEDED FOR WHEEZING AND FOR SHORTNESS OF BREATH 07/31/15   Brunetta Jeans, PA-C  simvastatin (ZOCOR) 10 MG tablet Take 1 tablet (10 mg total) by mouth at bedtime. 08/18/14   Brunetta Jeans, PA-C    Family History Family History  Problem Relation Age of Onset  . Hyperlipidemia Mother        Living  . Arthritis Mother   . Prostate cancer Father        Unsure if Cancer  . Allergies Father        Living  . Hearing loss Father   . Irritable bowel syndrome Father   . Alcoholism Maternal Grandfather   . Diabetes Paternal Grandmother   . Stroke Paternal Grandmother   . Epilepsy Maternal Grandmother   . Dementia Maternal Grandmother   . Alcoholism Paternal Grandfather   . Stroke Paternal Grandfather   . Other Paternal Grandfather        Colon Polyps  . Heart disease Maternal Aunt   . Transient ischemic attack Maternal Aunt   . Hyperlipidemia Sister        #1  . Healthy Brother   . Neuropathy Sister        #2  . Irritable bowel syndrome Sister   . Irritable bowel syndrome Son        #1  . Asthma Son   . Allergies Son   . Mental illness Son        Social D/O  . Allergies Son        #2, #3  . Hearing loss Son        #2    Social History Social History  Substance Use Topics  . Smoking status: Current Every Day Smoker    Packs/day: 0.10    Years: 20.00    Types: Cigarettes  . Smokeless tobacco: Never Used  . Alcohol use No     Allergies   Cat hair extract; Morphine;  Erythromycin; Penicillins; and Sulfa antibiotics   Review of Systems Review of Systems  Constitutional: Positive for fatigue. Negative for chills, diaphoresis and fever.  HENT: Negative for congestion, rhinorrhea and sneezing.   Eyes: Negative.   Respiratory: Positive for cough, chest tightness and shortness of breath.   Cardiovascular: Negative for chest pain and leg swelling.  Gastrointestinal: Negative for  abdominal pain, blood in stool, diarrhea, nausea and vomiting.  Genitourinary: Negative for difficulty urinating, flank pain, frequency and hematuria.  Musculoskeletal: Negative for arthralgias and back pain.  Skin: Negative for rash.  Neurological: Negative for dizziness, speech difficulty, weakness, numbness and headaches.     Physical Exam Updated Vital Signs BP (!) 157/93 (BP Location: Right Arm)   Pulse (!) 110   Temp 98.3 F (36.8 C) (Oral)   Resp (!) 24   Ht 5\' 1"  (1.549 m)   Wt 59 kg (130 lb)   SpO2 95%   BMI 24.56 kg/m   Physical Exam  Constitutional: She is oriented to person, place, and time. She appears well-developed and well-nourished.  HENT:  Head: Normocephalic and atraumatic.  Eyes: Pupils are equal, round, and reactive to light.  Neck: Normal range of motion. Neck supple.  Cardiovascular: Normal rate, regular rhythm and normal heart sounds.   Pulmonary/Chest: Effort normal. Tachypnea noted. No respiratory distress. She has wheezes. She has no rales. She exhibits no tenderness.  Abdominal: Soft. Bowel sounds are normal. There is no tenderness. There is no rebound and no guarding.  Musculoskeletal: Normal range of motion. She exhibits no edema.  Lymphadenopathy:    She has no cervical adenopathy.  Neurological: She is alert and oriented to person, place, and time.  Skin: Skin is warm and dry. No rash noted.  Psychiatric: She has a normal mood and affect.     ED Treatments / Results  Labs (all labs ordered are listed, but only abnormal results  are displayed) Labs Reviewed  BASIC METABOLIC PANEL - Abnormal; Notable for the following:       Result Value   Glucose, Bld 126 (*)    Creatinine, Ser 1.01 (*)    All other components within normal limits  CBC WITH DIFFERENTIAL/PLATELET - Abnormal; Notable for the following:    WBC 11.0 (*)    Eosinophils Absolute 1.1 (*)    All other components within normal limits  TROPONIN I    EKG  EKG Interpretation  Date/Time:  Sunday May 03 2017 14:20:53 EDT Ventricular Rate:  94 PR Interval:    QRS Duration: 79 QT Interval:  344 QTC Calculation: 431 R Axis:   54 Text Interpretation:  Sinus rhythm Anterior infarct, old Abnormal T, consider ischemia, diffuse leads No old tracing to compare Confirmed by Malvin Johns [phone]) on 05/03/2017 2:34:41 PM       Radiology Dg Chest 2 View  Result Date: 05/03/2017 CLINICAL DATA:  Cough, shortness of breath for 3 weeks, history COPD EXAM: CHEST  2 VIEW COMPARISON:  01/27/2016 FINDINGS: Normal heart size, mediastinal contours, and pulmonary vascularity. Lungs minimally hyperinflated but clear. No pleural effusion or pneumothorax. Bones unremarkable. IMPRESSION: No acute abnormalities. Electronically Signed   By: Lavonia Dana M.D.   On: 05/03/2017 14:13    Procedures Procedures (including critical care time)  Medications Ordered in ED Medications  ipratropium-albuterol (DUONEB) 0.5-2.5 (3) MG/3ML nebulizer solution 3 mL (3 mLs Nebulization Given 05/03/17 1337)  albuterol (PROVENTIL) (2.5 MG/3ML) 0.083% nebulizer solution 2.5 mg (2.5 mg Nebulization Given 05/03/17 1337)  predniSONE (DELTASONE) tablet 60 mg (60 mg Oral Given 05/03/17 1415)  albuterol (PROVENTIL) (2.5 MG/3ML) 0.083% nebulizer solution 5 mg (5 mg Nebulization Given 05/03/17 1425)  albuterol (PROVENTIL) (2.5 MG/3ML) 0.083% nebulizer solution 5 mg (5 mg Nebulization Given 05/03/17 1518)     Initial Impression / Assessment and Plan / ED Course  I have reviewed the triage vital signs  and  the nursing notes.  Pertinent labs & imaging results that were available during my care of the patient were reviewed by me and considered in my medical decision making (see chart for details).     Patient is a 56 year old female who presents with an asthma/COPD exacerbation. This seems to have been instigated by having exposure to indoor cats. There is no evidence of pneumonia. She has no hypoxia. However she does have ongoing wheezing after her first 2 nebulizer treatments. She is currently getting her third treatment. She has chest tightness which seems to be related to her reactive airway disease. This has improved after nebulizer treatments. Her EKG shows some T-wave inversion but we have no old EKG for comparison. Her troponin is pending. Assuming her troponin is negative and her respiratory status has improved, I anticipate discharge.  Dr. Stark Jock to follow.  Final Clinical Impressions(s) / ED Diagnoses   Final diagnoses:  COPD exacerbation ([place])    New Prescriptions New Prescriptions   PREDNISONE (DELTASONE) 20 MG TABLET    2 tabs po daily x 4 days     Malvin Johns, MD 05/03/17 1523

## 2017-05-03 NOTE — Discharge Instructions (Addendum)
Prednisone as prescribed.  Robitussin with codeine as needed for cough.  Zithromax as prescribed.  Continue your nebulizer treatments at home every 4 hours as needed for wheezing.  Return to the emergency department if your symptoms significantly worsen or change in the meantime.

## 2017-05-03 NOTE — ED Notes (Signed)
Pt discharged to home with family. NAD.  

## 2017-05-03 NOTE — ED Provider Notes (Signed)
Care assumed from Dr. Tamera Punt at shift change. Patient presents here with wheezing and a history of COPD. She was given multiple nebulizer treatments including an hour-long neb along with steroids and magnesium. Her wheezing has now improved and she feels "10 times better" than she did when she got here. The remainder of the workup is unremarkable. Her chest x-ray shows no acute abnormality.  She will be discharged with steroids, continued nebulizer treatments, and medicine for her cough and she states she has difficulty sleeping at night due to it.   Veryl Speak, MD 05/03/17 [phone]

## 2017-05-03 NOTE — ED Notes (Signed)
Patient transported to X-ray 

## 2017-05-03 NOTE — ED Notes (Signed)
Delay in xray, MD with pt  

## 2017-05-03 NOTE — ED Notes (Signed)
Ambulated in hall on room air, HR 110-115 RR 20-28, SpO2 94-98%, + DOE, purse lip breathing after back in room.

## 2017-06-19 ENCOUNTER — Encounter (HOSPITAL_BASED_OUTPATIENT_CLINIC_OR_DEPARTMENT_OTHER): Payer: Self-pay

## 2017-06-19 ENCOUNTER — Emergency Department (HOSPITAL_BASED_OUTPATIENT_CLINIC_OR_DEPARTMENT_OTHER): Payer: Self-pay

## 2017-06-19 ENCOUNTER — Emergency Department (HOSPITAL_BASED_OUTPATIENT_CLINIC_OR_DEPARTMENT_OTHER)
Admission: EM | Admit: 2017-06-19 | Discharge: 2017-06-19 | Disposition: A | Discharge status: Home / Self Care | Payer: Self-pay | Attending: Emergency Medicine | Admitting: Emergency Medicine

## 2017-06-19 DIAGNOSIS — Z79899 Other long term (current) drug therapy: Secondary | ICD-10-CM | POA: Insufficient documentation

## 2017-06-19 DIAGNOSIS — Z87891 Personal history of nicotine dependence: Secondary | ICD-10-CM | POA: Insufficient documentation

## 2017-06-19 DIAGNOSIS — J45909 Unspecified asthma, uncomplicated: Secondary | ICD-10-CM | POA: Insufficient documentation

## 2017-06-19 DIAGNOSIS — I10 Essential (primary) hypertension: Secondary | ICD-10-CM | POA: Insufficient documentation

## 2017-06-19 DIAGNOSIS — J441 Chronic obstructive pulmonary disease with (acute) exacerbation: Secondary | ICD-10-CM | POA: Insufficient documentation

## 2017-06-19 DIAGNOSIS — J449 Chronic obstructive pulmonary disease, unspecified: Secondary | ICD-10-CM | POA: Insufficient documentation

## 2017-06-19 HISTORY — DX: Chronic obstructive pulmonary disease, unspecified: J44.9

## 2017-06-19 LAB — BASIC METABOLIC PANEL
Anion gap: 9 (ref 5–15)
BUN: 7 mg/dL (ref 6–20)
CO2: 25 mmol/L (ref 22–32)
Calcium: 9.5 mg/dL (ref 8.9–10.3)
Chloride: 104 mmol/L (ref 101–111)
Creatinine, Ser: 0.92 mg/dL (ref 0.44–1.00)
GFR calc Af Amer: >60 mL/min (ref >60)
GFR calc non Af Amer: >60 mL/min (ref >60)
Glucose, Bld: 117 mg/dL — ABNORMAL HIGH (ref 65–99)
Potassium: 3.4 mmol/L — ABNORMAL LOW (ref 3.5–5.1)
Sodium: 138 mmol/L (ref 135–145)

## 2017-06-19 LAB — CBC
HCT: 42.2 % (ref 36.0–46.0)
Hemoglobin: 13.9 g/dL (ref 12.0–15.0)
MCH: 31 pg (ref 26.0–34.0)
MCHC: 32.9 g/dL (ref 30.0–36.0)
MCV: 94.2 fL (ref 78.0–100.0)
Platelets: 324 10*3/uL (ref 150–400)
RBC: 4.48 MIL/uL (ref 3.87–5.11)
RDW: 13.3 % (ref 11.5–15.5)
WBC: 10.4 10*3/uL (ref 4.0–10.5)

## 2017-06-19 LAB — TROPONIN I: Troponin I: <0.03 ng/mL (ref <0.03)

## 2017-06-19 MED ORDER — POTASSIUM CHLORIDE CRYS ER 20 MEQ PO TBCR
40.0000 meq | EXTENDED_RELEASE_TABLET | Freq: Once | ORAL | Status: AC
  Administered 2017-06-19: 40 meq via ORAL
  Filled 2017-06-19: qty 2

## 2017-06-19 MED ORDER — ALBUTEROL (5 MG/ML) CONTINUOUS INHALATION SOLN
15.0000 mg/h | INHALATION_SOLUTION | Freq: Once | RESPIRATORY_TRACT | Status: AC
  Administered 2017-06-19: 15 mg/h via RESPIRATORY_TRACT

## 2017-06-19 MED ORDER — GUAIFENESIN 100 MG/5ML PO SYRP: 100.0000 mg | ORAL_SOLUTION | ORAL | 0 refills | Status: DC | PRN

## 2017-06-19 MED ORDER — METHYLPREDNISOLONE SODIUM SUCC 125 MG IJ SOLR
125.0000 mg | Freq: Once | INTRAMUSCULAR | Status: AC
  Administered 2017-06-19: 125 mg via INTRAVENOUS
  Filled 2017-06-19: qty 2

## 2017-06-19 MED ORDER — ALBUTEROL SULFATE 108 (90 BASE) MCG/ACT IN AEPB: 1.0000 | INHALATION_SPRAY | RESPIRATORY_TRACT | 0 refills | Status: DC

## 2017-06-19 MED ORDER — ALBUTEROL (5 MG/ML) CONTINUOUS INHALATION SOLN
INHALATION_SOLUTION | RESPIRATORY_TRACT | Status: AC
  Administered 2017-06-19: 3 mg/h
  Filled 2017-06-19: qty 20

## 2017-06-19 MED ORDER — PREDNISONE 20 MG PO TABS: 60.0000 mg | ORAL_TABLET | Freq: Every day | ORAL | 0 refills | Status: AC

## 2017-06-19 MED ORDER — IPRATROPIUM BROMIDE 0.02 % IN SOLN
RESPIRATORY_TRACT | Status: AC
  Administered 2017-06-19: 0.5 mg
  Filled 2017-06-19: qty 2.5

## 2017-06-19 MED ORDER — DOXYCYCLINE HYCLATE 100 MG PO CAPS: 100.0000 mg | ORAL_CAPSULE | Freq: Two times a day (BID) | ORAL | 0 refills | Status: AC

## 2017-06-19 MED FILL — DOXYCYCLINE HYCLATE 100 MG: 100 | 7 days supply | Qty: 14 | Fill #0

## 2017-06-19 MED FILL — predniSONE 20 MG TABS: 20 | 5 days supply | Qty: 15 | Fill #0

## 2017-06-19 MED FILL — ROBAFEN 100 MG/5 ML SYRUP: 100 | 30 days supply | Qty: 118 | Fill #0

## 2017-06-19 NOTE — ED Notes (Addendum)
15mg  Albuterol CAT complete.

## 2017-06-19 NOTE — Discharge Instructions (Signed)
Please read and follow all provided instructions.  Your diagnoses today include:  1. COPD exacerbation ([place])     Tests performed today include: Vital signs. See below for your results today.   Medications prescribed:  Take as prescribed   Home care instructions:  Follow any educational materials contained in this packet.  Follow-up instructions: Please follow-up with your primary care provider for further evaluation of symptoms and treatment   Return instructions:  Please return to the Emergency Department if you do not get better, if you get worse, or new symptoms OR  - Fever (temperature greater than 101.75F)  - Bleeding that does not stop with holding pressure to the area    -Severe pain (please note that you may be more sore the day after your accident)  - Chest Pain  - Difficulty breathing  - Severe nausea or vomiting  - Inability to tolerate food and liquids  - Passing out  - Skin becoming red around your wounds  - Change in mental status (confusion or lethargy)  - New numbness or weakness    Please return if you have any other emergent concerns.  Additional Information:  Your vital signs today were: BP 135/83    Pulse (!) 101    Temp 98.8 F (37.1 C) (Oral)    Resp 18    Ht 5\' 1"  (1.549 m)    Wt 59 kg (130 lb)    SpO2 100%    BMI 24.56 kg/m  If your blood pressure (BP) was elevated above 135/85 this visit, please have this repeated by your doctor within one month. ---------------

## 2017-06-19 NOTE — ED Provider Notes (Signed)
[facility] DEPT [facility] Provider Note   CSN: [id number] Arrival date & time: 06/19/17  1213     History   Chief Complaint Chief Complaint  Patient presents with  . Shortness of Breath    HPI Tracie Smith is a 56 y.o. female.  HPI  56 y.o. female with a hx of COPD, HTN, presents to the Emergency Department today due to shortness of breath last night. Pt states hx same last month. Unsure of triggering cause. Noted allergies to cats as well as irritation with smoking. Pt lives with people upstairs who have cats and smoke. Notes mild non productive cough. No fevers. Noted chest pressure that she rates 4/10. Attributes to constricted lungs. No hx ACS. No N/V. No diaphoresis. No numbness/tingling. No abdominal pain. No meds PTA. Pt having trouble with PCP follow up given insurance issues. No other symptoms noted.   Past Medical History:  Diagnosis Date  . Alcoholism ([place])    Resolved  . Anxiety   . Asthma   . Bipolar 1 disorder ([place])   . Breast calcification, left   . Bronchitis   . Cat allergies   . COPD (chronic obstructive pulmonary disease) ([place])   . Depression   . Endometriosis   . Hypertension   . Insomnia   . Irritable bowel syndrome (IBS)   . Mental disorder   . Migraine   . Neuroma    Left Inguinal  . Ovarian cyst   . Seasonal allergies     Patient Active Problem List   Diagnosis Date Noted  . COPD exacerbation ([place]) 11/03/2014  . Acute maxillary sinusitis 11/03/2014  . Screening, ischemic heart disease 08/20/2014  . Visit for preventive health examination 08/20/2014  . Osteoporosis screening 08/20/2014  . Breast cancer screening 08/20/2014  . Encounter for cervical Pap smear with pelvic exam 08/20/2014  . Essential hypertension, benign 07/18/2014  . Unspecified chronic bronchitis ([place]) 07/18/2014  . Need for prophylactic vaccination and inoculation against influenza 07/18/2014  . Anxiety state 01/05/2014  . Bipolar affective disorder, currently  depressed, moderate ([place]) 01/05/2014  . Allergic rhinitis 03/26/2013  . Headache, migraine 03/26/2013    Past Surgical History:  Procedure Laterality Date  . ABDOMINAL HYSTERECTOMY    . BREAST BIOPSY     Left  . WISDOM TOOTH EXTRACTION      OB History    No data available       Home Medications    Prior to Admission medications   Medication Sig Start Date End Date Taking? Authorizing Provider  albuterol (PROVENTIL HFA;VENTOLIN HFA) 108 (90 Base) MCG/ACT inhaler Inhale 1-2 puffs into the lungs every 6 (six) hours as needed for wheezing or shortness of breath. 01/24/17   Noe Gens, PA-C  albuterol (PROVENTIL) (2.5 MG/3ML) 0.083% nebulizer solution Take 3 mLs (2.5 mg total) by nebulization every 6 (six) hours as needed for wheezing or shortness of breath. 01/16/15   Brunetta Jeans, PA-C  ALPRAZolam Duanne Moron) 1 MG tablet Take 1 mg by mouth 3 (three) times daily as needed for anxiety or sleep.    [provider]  azithromycin (ZITHROMAX) 250 MG tablet Take 1 tablet (250 mg total) by mouth daily. Take first 2 tablets together, then 1 every day until finished. 01/24/17   Noe Gens, PA-C  lithium 300 MG tablet Take 300 mg by mouth 3 (three) times daily.    [provider]    Family History Family History  Problem Relation Age of Onset  .  Hyperlipidemia Mother        Living  . Arthritis Mother   . Prostate cancer Father        Unsure if Cancer  . Allergies Father        Living  . Hearing loss Father   . Irritable bowel syndrome Father   . Alcoholism Maternal Grandfather   . Diabetes Paternal Grandmother   . Stroke Paternal Grandmother   . Epilepsy Maternal Grandmother   . Dementia Maternal Grandmother   . Alcoholism Paternal Grandfather   . Stroke Paternal Grandfather   . Other Paternal Grandfather        Colon Polyps  . Heart disease Maternal Aunt   . Transient ischemic attack Maternal Aunt   . Hyperlipidemia Sister        #1  . Healthy Brother     . Neuropathy Sister        #2  . Irritable bowel syndrome Sister   . Irritable bowel syndrome Son        #1  . Asthma Son   . Allergies Son   . Mental illness Son        Social D/O  . Allergies Son        #2, #3  . Hearing loss Son        #2    Social History Social History  Substance Use Topics  . Smoking status: Former Smoker    Years: 20.00  . Smokeless tobacco: Never Used     Comment: quit Jan 2017  . Alcohol use No     Allergies   Cat hair extract; Morphine; Erythromycin; Penicillins; and Sulfa antibiotics   Review of Systems Review of Systems ROS reviewed and all are negative for acute change except as noted in the HPI.  Physical Exam Updated Vital Signs BP (!) 141/96   Pulse 93   Temp 98.8 F (37.1 C) (Oral)   Resp (!) 23   Ht 5\' 1"  (1.549 m)   Wt 59 kg (130 lb)   SpO2 100%   BMI 24.56 kg/m   Physical Exam  Constitutional: She is oriented to person, place, and time. She appears well-developed and well-nourished. No distress.  HENT:  Head: Normocephalic and atraumatic.  Right Ear: Tympanic membrane, external ear and ear canal normal.  Left Ear: Tympanic membrane, external ear and ear canal normal.  Nose: Nose normal.  Mouth/Throat: Uvula is midline, oropharynx is clear and moist and mucous membranes are normal. No trismus in the jaw. No oropharyngeal exudate, posterior oropharyngeal erythema or tonsillar abscesses.  Eyes: Pupils are equal, round, and reactive to light. EOM are normal.  Neck: Normal range of motion. Neck supple. No tracheal deviation present.  Cardiovascular: Normal rate, regular rhythm, S1 normal, S2 normal, normal heart sounds, intact distal pulses and normal pulses.   Pulmonary/Chest: Effort normal. No respiratory distress. She has no decreased breath sounds. She has wheezes in the right upper field, the right lower field, the left upper field and the left lower field. She has no rhonchi. She has no rales.  Abdominal: Normal  appearance and bowel sounds are normal. There is no tenderness.  Musculoskeletal: Normal range of motion.  Neurological: She is alert and oriented to person, place, and time.  Skin: Skin is warm and dry.  Psychiatric: She has a normal mood and affect. Her speech is normal and behavior is normal. Thought content normal.     ED Treatments / Results  Labs (all labs ordered are  listed, but only abnormal results are displayed) Labs Reviewed  BASIC METABOLIC PANEL - Abnormal; Notable for the following:       Result Value   Potassium 3.4 (*)    Glucose, Bld 117 (*)    All other components within normal limits  CBC  TROPONIN I    EKG  EKG Interpretation None       Radiology Dg Chest 2 View  Result Date: 06/19/2017 CLINICAL DATA:  Dyspnea.  Shortness breath.  Ex-smoker.  Asthma. EXAM: CHEST  2 VIEW COMPARISON:  05/03/2017. FINDINGS: Normal sized heart. Normal vascularity. Interval minimal bibasilar linear atelectasis. The hemidiaphragms remain flattened on the lateral view. Mild diffuse peribronchial thickening without significant change. Unremarkable bones. IMPRESSION: Interval minimal bibasilar linear atelectasis with stable mild changes of COPD and chronic bronchitis. Electronically Signed   By: Claudie Revering M.D.   On: 06/19/2017 13:57    Procedures Procedures (including critical care time)  Medications Ordered in ED Medications  albuterol (PROVENTIL, VENTOLIN) (5 MG/ML) 0.5% continuous inhalation solution (3 mg/hr  Given 06/19/17 1234)  ipratropium (ATROVENT) 0.02 % nebulizer solution (0.5 mg  Given 06/19/17 1234)  methylPREDNISolone sodium succinate (SOLU-MEDROL) 125 mg/2 mL injection 125 mg (125 mg Intravenous Given 06/19/17 1358)  potassium chloride SA (K-DUR,KLOR-CON) CR tablet 40 mEq (40 mEq Oral Given 06/19/17 1410)  albuterol (PROVENTIL,VENTOLIN) solution continuous neb (15 mg/hr Nebulization Given 06/19/17 1401)     Initial Impression / Assessment and Plan / ED Course  I  have reviewed the triage vital signs and the nursing notes.  Pertinent labs & imaging results that were available during my care of the patient were reviewed by me and considered in my medical decision making (see chart for details).  Final Clinical Impressions(s) / ED Diagnoses  {I have reviewed and evaluated the relevant laboratory values. {I have reviewed and evaluated the relevant imaging studies. {I have interpreted the relevant EKG. {I have reviewed the relevant previous healthcare records.  {I obtained HPI from historian.   ED Course:  Assessment: Pt is a 56 y.o. with a hx of COPD, HTN, presents to the Emergency Department today due to shortness of breath last night. Pt states hx same last month. Unsure of triggering cause. Noted allergies to cats as well as irritation with smoking. Pt lives with people upstairs who have cats and smoke. Notes mild non productive cough. No fevers. Noted chest pressure that she rates 4/10. Attributes to constricted lungs. No hx ACS. No N/V. No diaphoresis. No numbness/tingling. No abdominal pain. No meds PTA. Pt having trouble with PCP follow up given insurance issues. Given neb treatments and steroids in ED with improvement. Patient is to be discharged with recommendation to follow up with PCP in regards to today's hospital visit. Chest pain is not likely of cardiac etiology d/t presentation, VSS, no tracheal deviation, no JVD or new murmur, RRR, breath sounds equal bilaterally, EKG without acute abnormalities, negative troponin, and negative CXR. Given Rx Steroids and Albuterol. Pt has been advised to return to the ED is CP becomes exertional, associated with diaphoresis or nausea, radiates to left jaw/arm, worsens or becomes concerning in any way. Pt appears reliable for follow up and is agreeable to discharge. Patient is in no acute distress. Vital Signs are stable. Patient is able to ambulate. Patient able to tolerate PO.   Disposition/Plan:  DC  Home Additional Verbal discharge instructions given and discussed with patient.  Pt Instructed to f/u with PCP in the next week for evaluation and  treatment of symptoms. Return precautions given Pt acknowledges and agrees with plan  Supervising Physician Virgel Manifold, MD  Final diagnoses:  COPD exacerbation [facility])    New Prescriptions New Prescriptions   No medications on file     Shary Decamp, Hershal Coria 06/19/17 1510    Virgel Manifold, MD 06/24/17 [phone]

## 2017-06-19 NOTE — ED Triage Notes (Signed)
C/o SOB-was seen here for same last month and by PCP-pt with labored breathing but is speaking in full sentences

## 2018-07-08 IMAGING — CR DG CHEST 2V
2 series · 2 of 2 positions shown · non-contrast
Comparison: 01/27/2016

CLINICAL DATA: Cough, shortness of breath for 3 weeks, history COPD

EXAM:
CHEST  2 VIEW

[w chest pa]
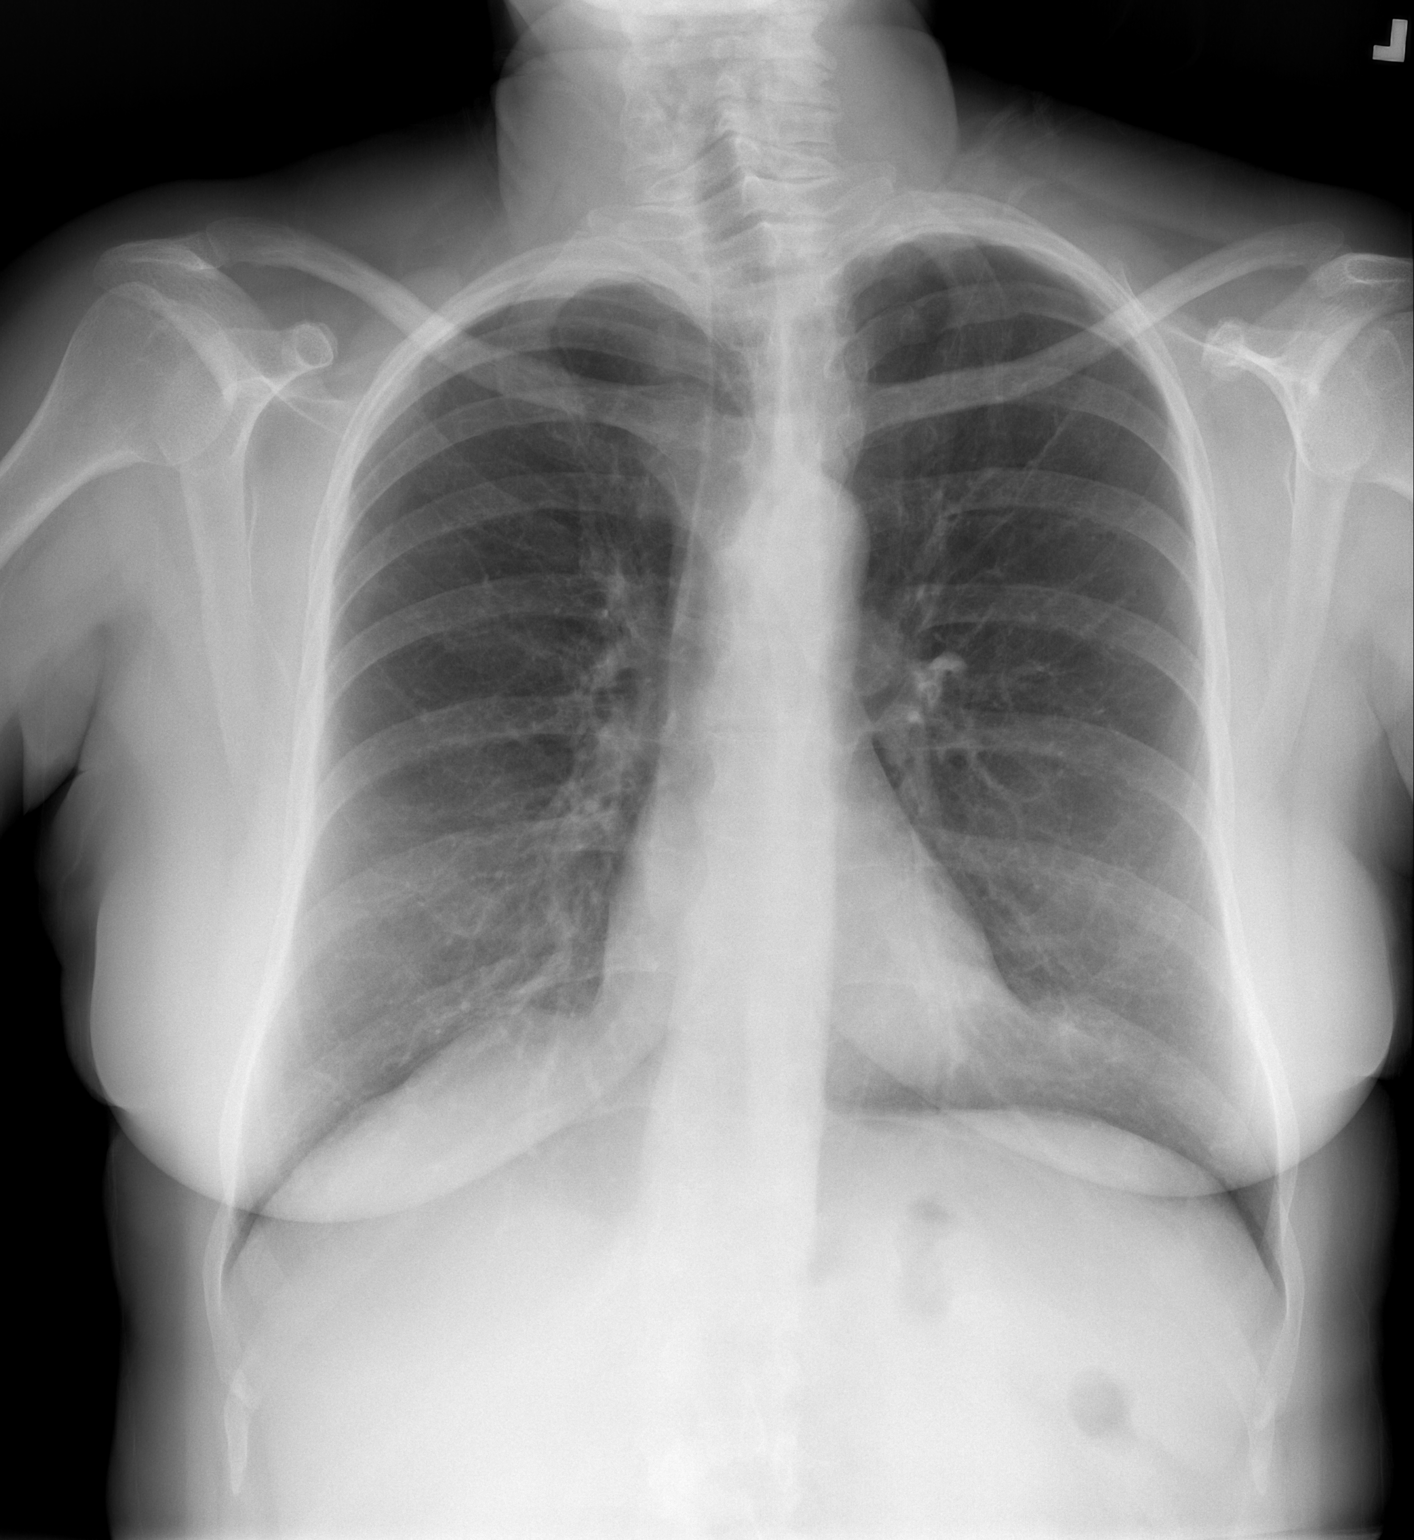

[w chest lat]
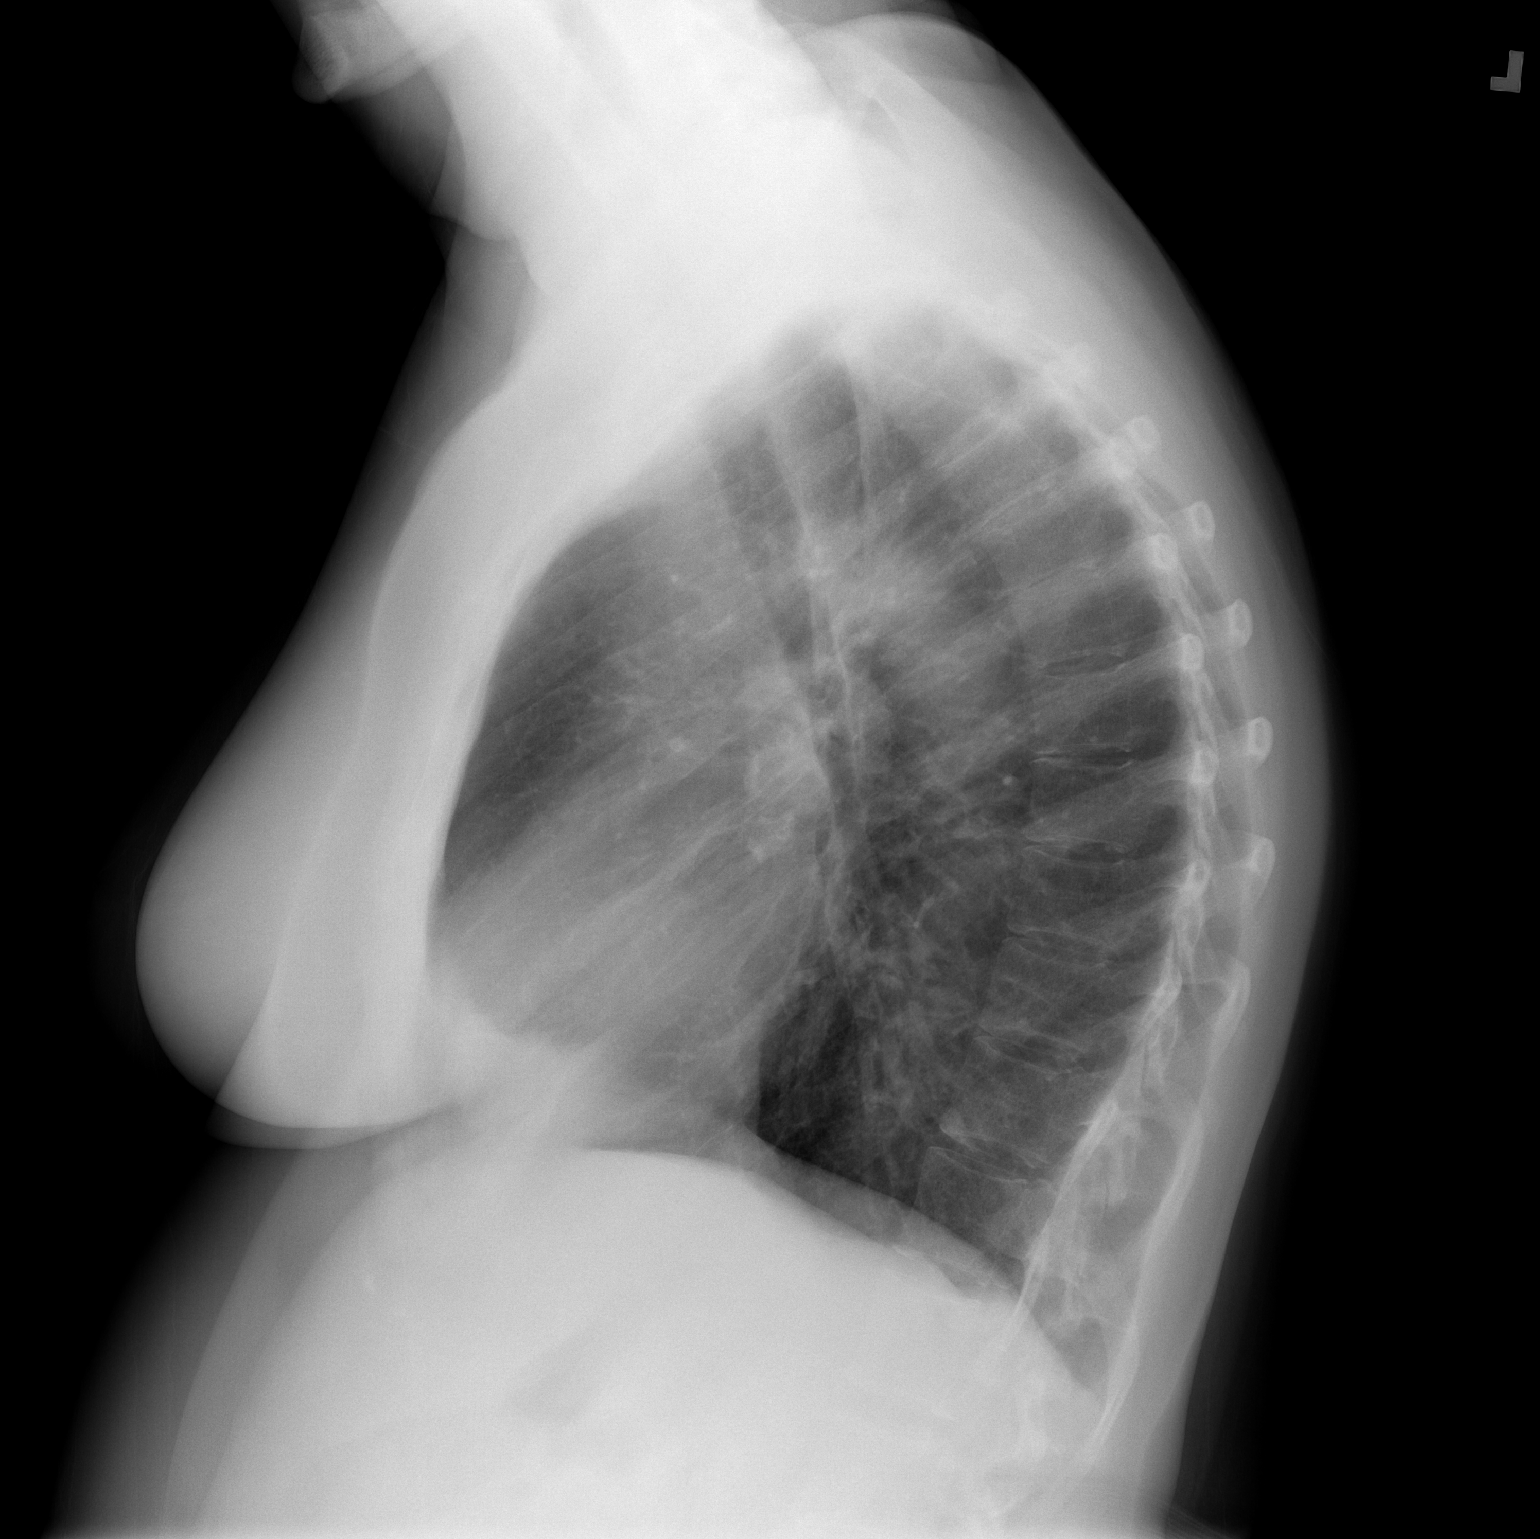

[2 of 2 positions shown; findings below may reference images not displayed]

FINDINGS: Normal heart size, mediastinal contours, and pulmonary vascularity.

Lungs minimally hyperinflated but clear.

No pleural effusion or pneumothorax.

Bones unremarkable.
IMPRESSION: No acute abnormalities.

## 2018-08-24 IMAGING — DX DG CHEST 2V
2 series · 2 of 2 positions shown · non-contrast
Comparison: 05/03/2017.

CLINICAL DATA: Dyspnea.  Shortness breath.  Ex-smoker.  Asthma.

EXAM:
CHEST  2 VIEW

[chest pa]
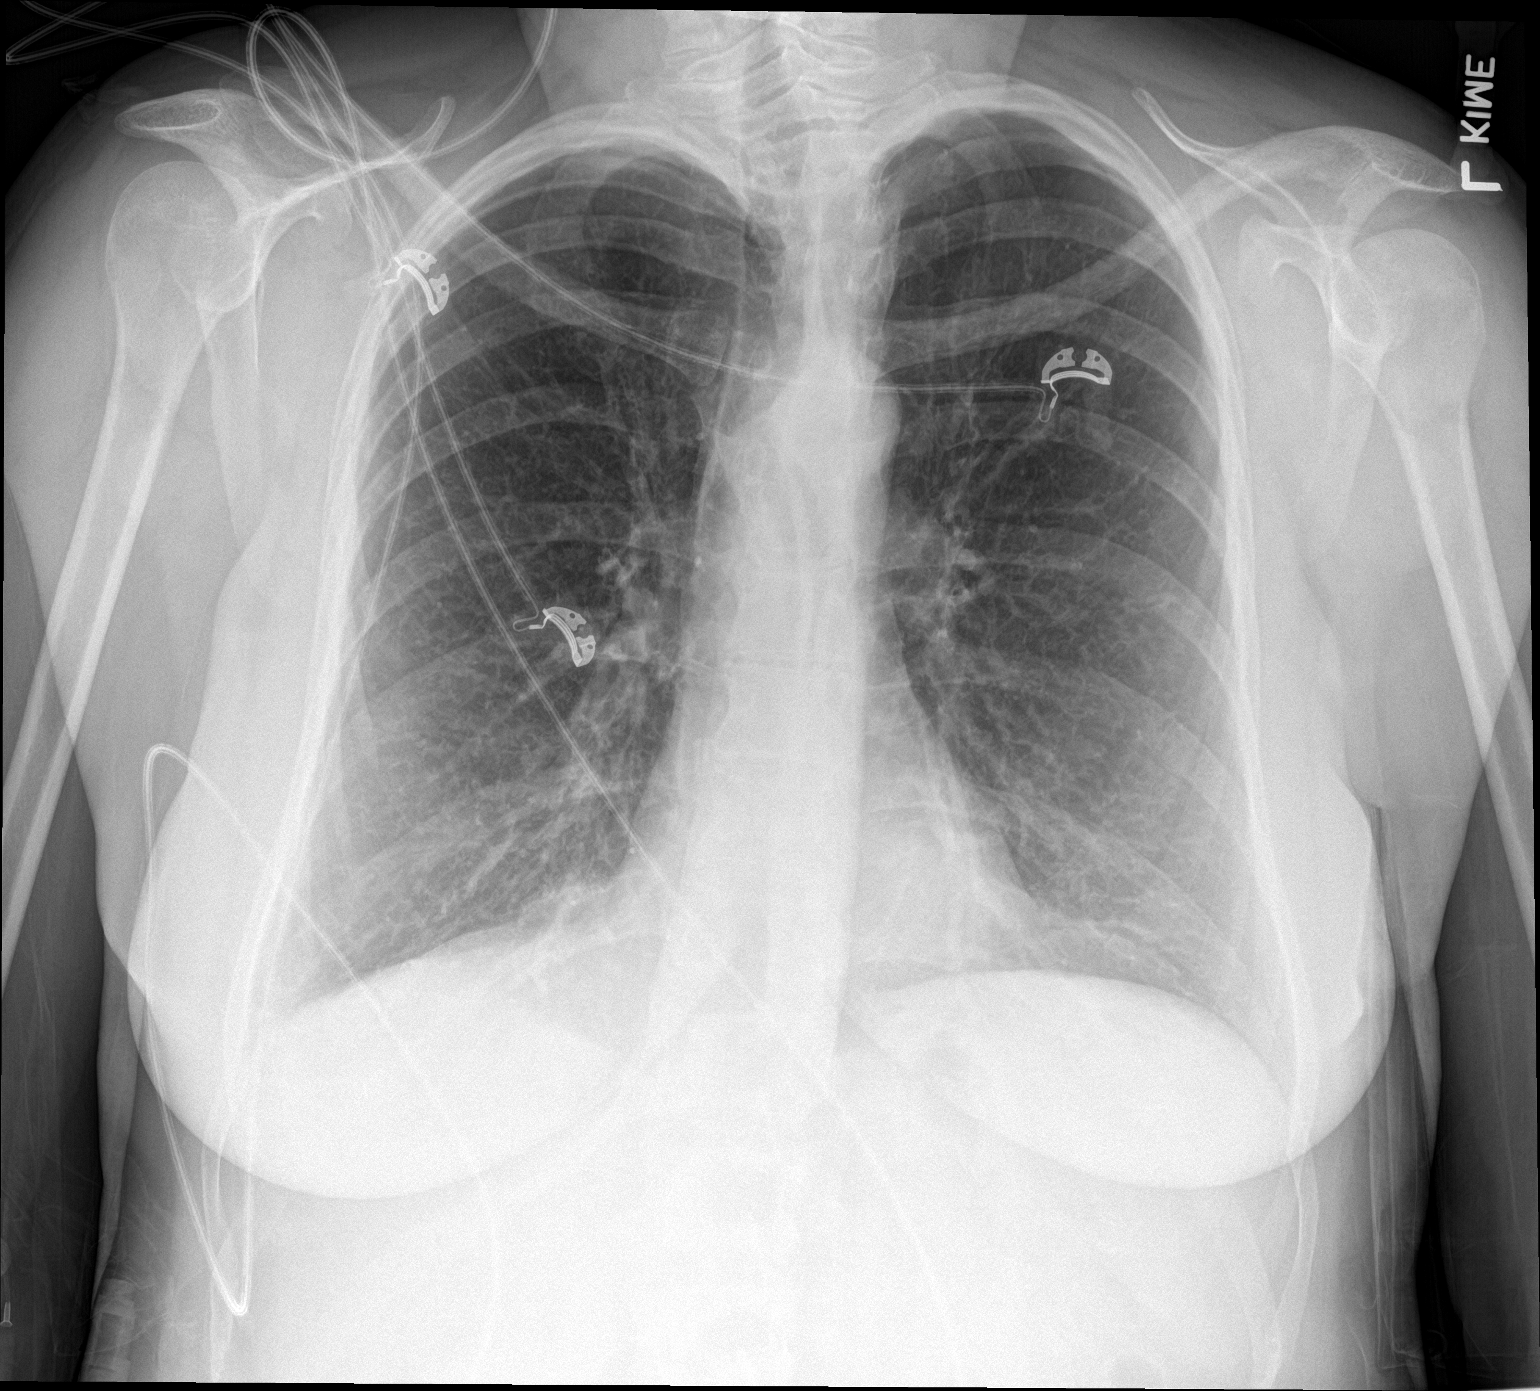

[chest lat]
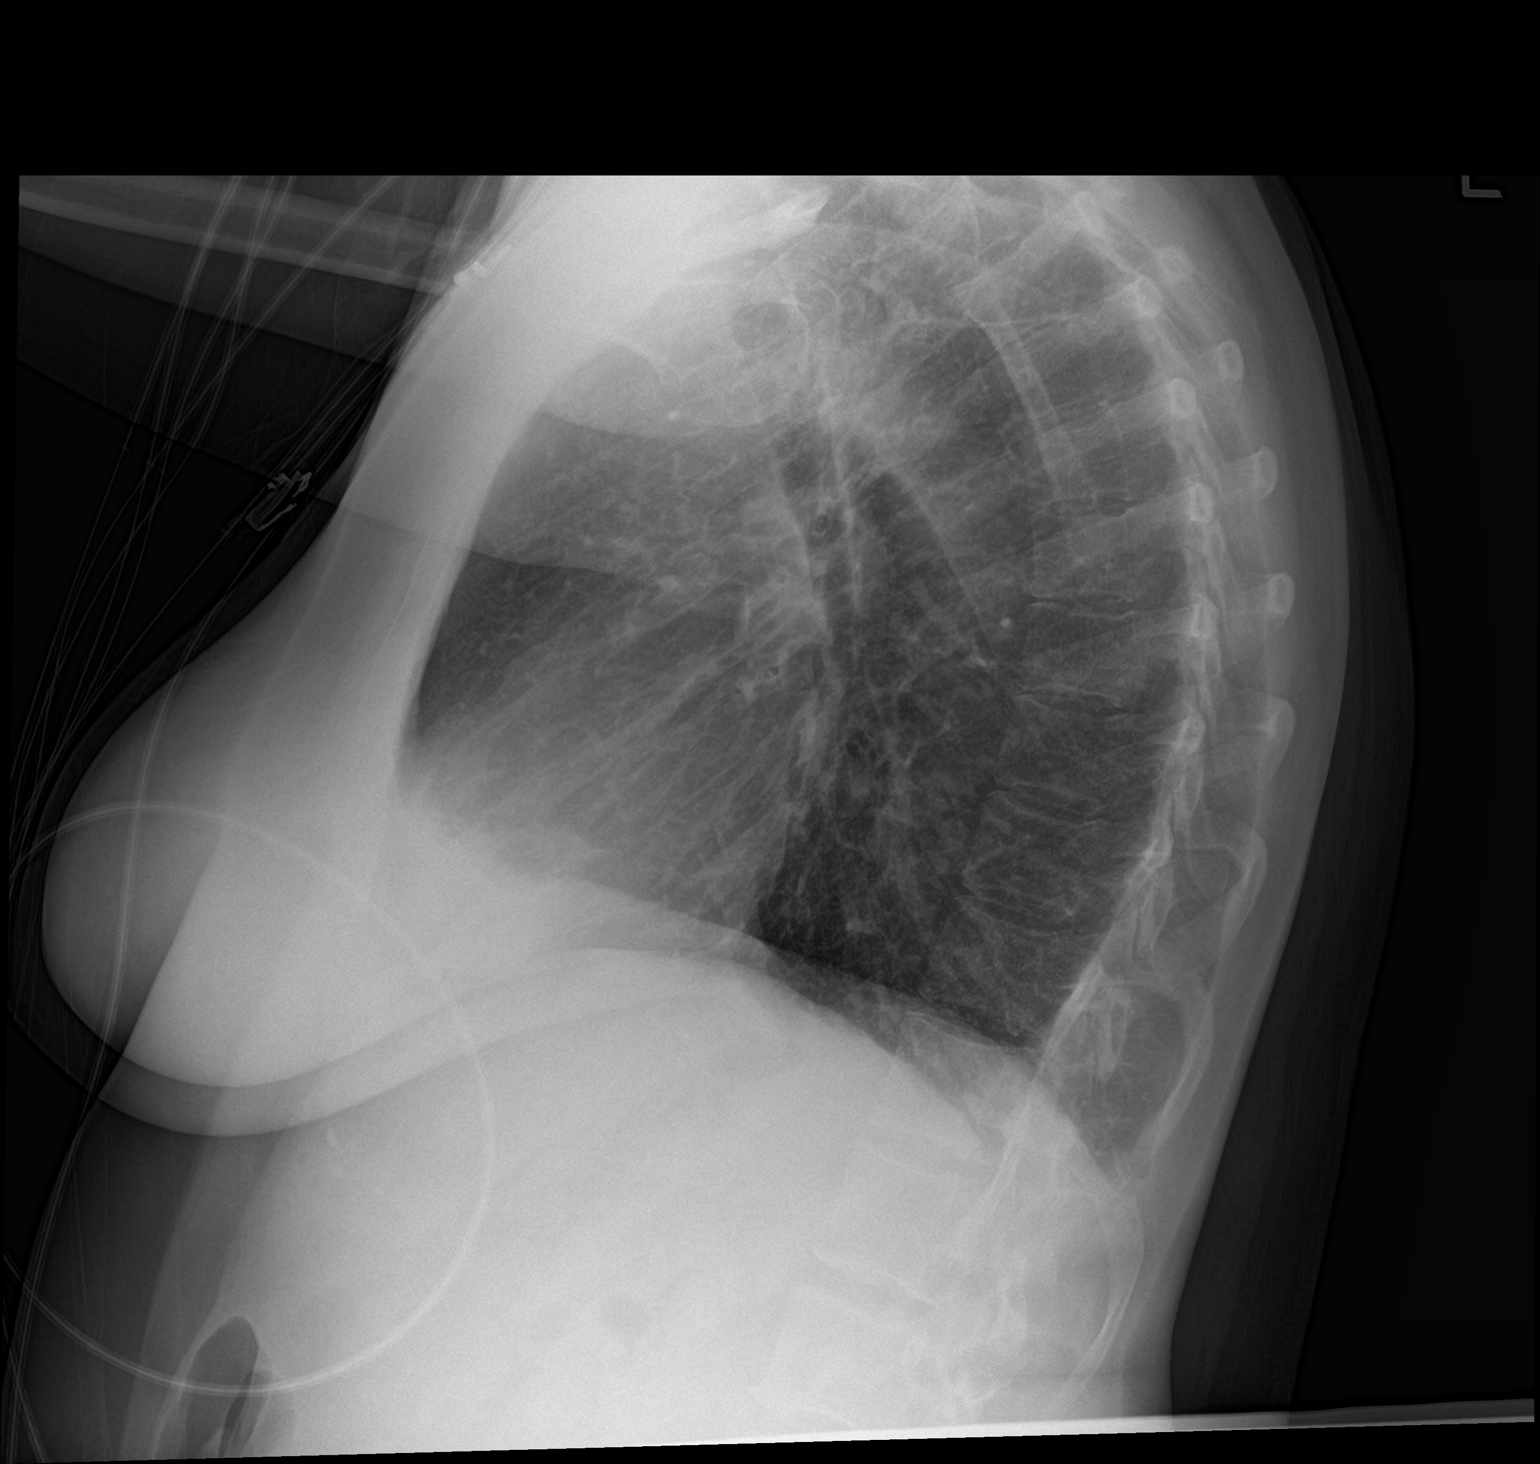

[2 of 2 positions shown; findings below may reference images not displayed]

FINDINGS: Normal sized heart. Normal vascularity. Interval minimal bibasilar
linear atelectasis. The hemidiaphragms remain flattened on the
lateral view. Mild diffuse peribronchial thickening without
significant change. Unremarkable bones.
IMPRESSION: Interval minimal bibasilar linear atelectasis with stable mild
changes of COPD and chronic bronchitis.

## 2023-04-20 DEATH — deceased
